# Patient Record
Sex: Female | Born: 1960 | Race: Black or African American | Hispanic: No | Smoking: Current every day smoker
Health system: Southern US, Community
[De-identification: ages and names within clinical notes are randomized; demographics above are authoritative.]

## PROBLEM LIST (undated history)

## (undated) DIAGNOSIS — M7580 Other shoulder lesions, unspecified shoulder: Secondary | ICD-10-CM

## (undated) DIAGNOSIS — Z79899 Other long term (current) drug therapy: Secondary | ICD-10-CM

## (undated) DIAGNOSIS — R002 Palpitations: Secondary | ICD-10-CM

## (undated) DIAGNOSIS — J301 Allergic rhinitis due to pollen: Secondary | ICD-10-CM

## (undated) DIAGNOSIS — R109 Unspecified abdominal pain: Secondary | ICD-10-CM

## (undated) DIAGNOSIS — E049 Nontoxic goiter, unspecified: Secondary | ICD-10-CM

## (undated) DIAGNOSIS — K069 Disorder of gingiva and edentulous alveolar ridge, unspecified: Secondary | ICD-10-CM

## (undated) DIAGNOSIS — R32 Unspecified urinary incontinence: Secondary | ICD-10-CM

## (undated) DIAGNOSIS — R05 Cough: Secondary | ICD-10-CM

## (undated) DIAGNOSIS — B0229 Other postherpetic nervous system involvement: Secondary | ICD-10-CM

## (undated) DIAGNOSIS — R059 Cough, unspecified: Secondary | ICD-10-CM

## (undated) DIAGNOSIS — N946 Dysmenorrhea, unspecified: Secondary | ICD-10-CM

## (undated) DIAGNOSIS — F419 Anxiety disorder, unspecified: Secondary | ICD-10-CM

## (undated) DIAGNOSIS — R9431 Abnormal electrocardiogram [ECG] [EKG]: Secondary | ICD-10-CM

## (undated) DIAGNOSIS — K921 Melena: Secondary | ICD-10-CM

## (undated) DIAGNOSIS — R5383 Other fatigue: Secondary | ICD-10-CM

## (undated) DIAGNOSIS — J209 Acute bronchitis, unspecified: Secondary | ICD-10-CM

## (undated) DIAGNOSIS — R079 Chest pain, unspecified: Secondary | ICD-10-CM

## (undated) DIAGNOSIS — M25559 Pain in unspecified hip: Secondary | ICD-10-CM

## (undated) DIAGNOSIS — M545 Low back pain, unspecified: Secondary | ICD-10-CM

## (undated) DIAGNOSIS — M461 Sacroiliitis, not elsewhere classified: Secondary | ICD-10-CM

## (undated) DIAGNOSIS — K5010 Crohn's disease of large intestine without complications: Secondary | ICD-10-CM

## (undated) DIAGNOSIS — B370 Candidal stomatitis: Secondary | ICD-10-CM

## (undated) DIAGNOSIS — K219 Gastro-esophageal reflux disease without esophagitis: Secondary | ICD-10-CM

## (undated) DIAGNOSIS — R5381 Other malaise: Secondary | ICD-10-CM

## (undated) DIAGNOSIS — I10 Essential (primary) hypertension: Secondary | ICD-10-CM

## (undated) DIAGNOSIS — F17200 Nicotine dependence, unspecified, uncomplicated: Secondary | ICD-10-CM

## (undated) DIAGNOSIS — L02229 Furuncle of trunk, unspecified: Secondary | ICD-10-CM

## (undated) DIAGNOSIS — E1165 Type 2 diabetes mellitus with hyperglycemia: Secondary | ICD-10-CM

## (undated) DIAGNOSIS — F32A Depression, unspecified: Secondary | ICD-10-CM

## (undated) DIAGNOSIS — R1031 Right lower quadrant pain: Secondary | ICD-10-CM

## (undated) DIAGNOSIS — R209 Unspecified disturbances of skin sensation: Secondary | ICD-10-CM

## (undated) DIAGNOSIS — F329 Major depressive disorder, single episode, unspecified: Secondary | ICD-10-CM

## (undated) DIAGNOSIS — K056 Periodontal disease, unspecified: Secondary | ICD-10-CM

## (undated) DIAGNOSIS — K589 Irritable bowel syndrome without diarrhea: Secondary | ICD-10-CM

## (undated) DIAGNOSIS — R064 Hyperventilation: Secondary | ICD-10-CM

## (undated) DIAGNOSIS — K5900 Constipation, unspecified: Secondary | ICD-10-CM

## (undated) DIAGNOSIS — M1990 Unspecified osteoarthritis, unspecified site: Secondary | ICD-10-CM

## (undated) DIAGNOSIS — L02239 Carbuncle of trunk, unspecified: Secondary | ICD-10-CM

## (undated) DIAGNOSIS — E119 Type 2 diabetes mellitus without complications: Secondary | ICD-10-CM

## (undated) DIAGNOSIS — A4102 Sepsis due to Methicillin resistant Staphylococcus aureus: Secondary | ICD-10-CM

## (undated) DIAGNOSIS — G8100 Flaccid hemiplegia affecting unspecified side: Secondary | ICD-10-CM

## (undated) DIAGNOSIS — R110 Nausea: Secondary | ICD-10-CM

## (undated) DIAGNOSIS — R609 Edema, unspecified: Secondary | ICD-10-CM

## (undated) DIAGNOSIS — J449 Chronic obstructive pulmonary disease, unspecified: Secondary | ICD-10-CM

## (undated) DIAGNOSIS — R809 Proteinuria, unspecified: Secondary | ICD-10-CM

## (undated) DIAGNOSIS — K5090 Crohn's disease, unspecified, without complications: Secondary | ICD-10-CM

## (undated) DIAGNOSIS — I2510 Atherosclerotic heart disease of native coronary artery without angina pectoris: Secondary | ICD-10-CM

## (undated) DIAGNOSIS — E785 Hyperlipidemia, unspecified: Secondary | ICD-10-CM

## (undated) DIAGNOSIS — K449 Diaphragmatic hernia without obstruction or gangrene: Secondary | ICD-10-CM

## (undated) DIAGNOSIS — E079 Disorder of thyroid, unspecified: Secondary | ICD-10-CM

## (undated) DIAGNOSIS — IMO0001 Reserved for inherently not codable concepts without codable children: Secondary | ICD-10-CM

## (undated) DIAGNOSIS — J45909 Unspecified asthma, uncomplicated: Secondary | ICD-10-CM

## (undated) HISTORY — DX: Furuncle of trunk, unspecified: L02.229

## (undated) HISTORY — DX: Other malaise: R53.81

## (undated) HISTORY — DX: Cough, unspecified: R05.9

## (undated) HISTORY — DX: Gastro-esophageal reflux disease without esophagitis: K21.9

## (undated) HISTORY — DX: Sepsis due to methicillin resistant Staphylococcus aureus: A41.02

## (undated) HISTORY — PX: TUBAL LIGATION: SHX77

## (undated) HISTORY — DX: Candidal stomatitis: B37.0

## (undated) HISTORY — DX: Unspecified abdominal pain: R10.9

## (undated) HISTORY — DX: Chest pain, unspecified: R07.9

## (undated) HISTORY — DX: Right lower quadrant pain: R10.31

## (undated) HISTORY — DX: Crohn's disease, unspecified, without complications: K50.90

## (undated) HISTORY — DX: Melena: K92.1

## (undated) HISTORY — DX: Sacroiliitis, not elsewhere classified: M46.1

## (undated) HISTORY — DX: Nicotine dependence, unspecified, uncomplicated: F17.200

## (undated) HISTORY — DX: Allergic rhinitis due to pollen: J30.1

## (undated) HISTORY — DX: Crohn's disease of large intestine without complications: K50.10

## (undated) HISTORY — DX: Dysmenorrhea, unspecified: N94.6

## (undated) HISTORY — DX: Constipation, unspecified: K59.00

## (undated) HISTORY — DX: Periodontal disease, unspecified: K05.6

## (undated) HISTORY — DX: Irritable bowel syndrome, unspecified: K58.9

## (undated) HISTORY — DX: Nontoxic goiter, unspecified: E04.9

## (undated) HISTORY — DX: Unspecified urinary incontinence: R32

## (undated) HISTORY — DX: Type 2 diabetes mellitus without complications: E11.9

## (undated) HISTORY — DX: Abnormal electrocardiogram (ECG) (EKG): R94.31

## (undated) HISTORY — DX: Nausea: R11.0

## (undated) HISTORY — DX: Other long term (current) drug therapy: Z79.899

## (undated) HISTORY — DX: Unspecified disturbances of skin sensation: R20.9

## (undated) HISTORY — DX: Carbuncle of trunk, unspecified: L02.239

## (undated) HISTORY — DX: Other postherpetic nervous system involvement: B02.29

## (undated) HISTORY — DX: Cough: R05

## (undated) HISTORY — DX: Unspecified asthma, uncomplicated: J45.909

## (undated) HISTORY — DX: Diaphragmatic hernia without obstruction or gangrene: K44.9

## (undated) HISTORY — DX: Hyperventilation: R06.4

## (undated) HISTORY — DX: Anxiety disorder, unspecified: F41.9

## (undated) HISTORY — DX: Other fatigue: R53.83

## (undated) HISTORY — DX: Low back pain: M54.5

## (undated) HISTORY — DX: Palpitations: R00.2

## (undated) HISTORY — DX: Hyperlipidemia, unspecified: E78.5

## (undated) HISTORY — DX: Flaccid hemiplegia affecting unspecified side: G81.00

## (undated) HISTORY — DX: Unspecified osteoarthritis, unspecified site: M19.90

## (undated) HISTORY — DX: Reserved for inherently not codable concepts without codable children: IMO0001

## (undated) HISTORY — PX: OTHER SURGICAL HISTORY: SHX169

## (undated) HISTORY — DX: Disorder of gingiva and edentulous alveolar ridge, unspecified: K06.9

## (undated) HISTORY — DX: Acute bronchitis, unspecified: J20.9

## (undated) HISTORY — DX: Pain in unspecified hip: M25.559

## (undated) HISTORY — DX: Low back pain, unspecified: M54.50

## (undated) HISTORY — DX: Type 2 diabetes mellitus with hyperglycemia: E11.65

## (undated) HISTORY — DX: Edema, unspecified: R60.9

---

## 1980-07-17 HISTORY — PX: OTHER SURGICAL HISTORY: SHX169

## 1991-07-18 HISTORY — PX: OTHER SURGICAL HISTORY: SHX169

## 1997-10-20 ENCOUNTER — Other Ambulatory Visit: Admission: RE | Admit: 1997-10-20 | Discharge: 1997-10-20 | Payer: Self-pay | Admitting: Gynecology

## 2000-06-05 ENCOUNTER — Emergency Department (HOSPITAL_COMMUNITY): Admission: EM | Admit: 2000-06-05 | Discharge: 2000-06-05 | Payer: Self-pay | Admitting: Emergency Medicine

## 2000-06-05 ENCOUNTER — Encounter: Payer: Self-pay | Admitting: Emergency Medicine

## 2001-11-06 ENCOUNTER — Encounter: Payer: Self-pay | Admitting: Emergency Medicine

## 2001-11-06 ENCOUNTER — Emergency Department (HOSPITAL_COMMUNITY): Admission: EM | Admit: 2001-11-06 | Discharge: 2001-11-07 | Payer: Self-pay | Admitting: Emergency Medicine

## 2001-11-27 ENCOUNTER — Encounter: Payer: Self-pay | Admitting: Internal Medicine

## 2001-11-27 ENCOUNTER — Ambulatory Visit (HOSPITAL_COMMUNITY): Admission: RE | Admit: 2001-11-27 | Discharge: 2001-11-27 | Payer: Self-pay | Admitting: Internal Medicine

## 2003-08-03 ENCOUNTER — Emergency Department (HOSPITAL_COMMUNITY): Admission: EM | Admit: 2003-08-03 | Discharge: 2003-08-03 | Payer: Self-pay | Admitting: Emergency Medicine

## 2004-02-29 ENCOUNTER — Emergency Department (HOSPITAL_COMMUNITY): Admission: EM | Admit: 2004-02-29 | Discharge: 2004-02-29 | Payer: Self-pay | Admitting: Emergency Medicine

## 2004-09-08 ENCOUNTER — Encounter (INDEPENDENT_AMBULATORY_CARE_PROVIDER_SITE_OTHER): Payer: Self-pay | Admitting: *Deleted

## 2004-09-08 ENCOUNTER — Ambulatory Visit (HOSPITAL_COMMUNITY): Admission: RE | Admit: 2004-09-08 | Discharge: 2004-09-08 | Payer: Self-pay | Admitting: Gastroenterology

## 2004-09-20 ENCOUNTER — Encounter: Admission: RE | Admit: 2004-09-20 | Discharge: 2004-09-20 | Payer: Self-pay | Admitting: Gastroenterology

## 2006-01-04 ENCOUNTER — Encounter: Admission: RE | Admit: 2006-01-04 | Discharge: 2006-01-04 | Payer: Self-pay | Admitting: Internal Medicine

## 2006-07-25 ENCOUNTER — Emergency Department (HOSPITAL_COMMUNITY): Admission: EM | Admit: 2006-07-25 | Discharge: 2006-07-25 | Payer: Self-pay | Admitting: Emergency Medicine

## 2006-08-20 ENCOUNTER — Ambulatory Visit: Payer: Self-pay | Admitting: Internal Medicine

## 2006-08-27 ENCOUNTER — Ambulatory Visit: Payer: Self-pay | Admitting: Internal Medicine

## 2006-08-28 ENCOUNTER — Ambulatory Visit: Payer: Self-pay | Admitting: Gastroenterology

## 2006-08-28 LAB — CONVERTED CEMR LAB
ALT: 10 units/L (ref 0–40)
BUN: 11 mg/dL (ref 6–23)
Eosinophils Absolute: 0.3 10*3/uL (ref 0.0–0.6)
Eosinophils Relative: 2.5 % (ref 0.0–5.0)
GFR calc non Af Amer: 82 mL/min
Glucose, Bld: 126 mg/dL — ABNORMAL HIGH (ref 70–99)
Hemoglobin: 14.4 g/dL (ref 12.0–15.0)
Monocytes Absolute: 0.3 10*3/uL (ref 0.2–0.7)
Monocytes Relative: 2.6 % — ABNORMAL LOW (ref 3.0–11.0)
Neutrophils Relative %: 77.1 % — ABNORMAL HIGH (ref 43.0–77.0)
Potassium: 4 meq/L (ref 3.5–5.1)
RBC: 5.14 M/uL — ABNORMAL HIGH (ref 3.87–5.11)
Sed Rate: 13 mm/hr (ref 0–25)
TSH: 0.3 microintl units/mL — ABNORMAL LOW (ref 0.35–5.50)
Total Bilirubin: 0.6 mg/dL (ref 0.3–1.2)
WBC: 13.4 10*3/uL — ABNORMAL HIGH (ref 4.5–10.5)

## 2006-08-31 ENCOUNTER — Ambulatory Visit: Payer: Self-pay | Admitting: Cardiology

## 2006-09-10 ENCOUNTER — Ambulatory Visit: Payer: Self-pay | Admitting: Gastroenterology

## 2006-09-10 ENCOUNTER — Encounter (INDEPENDENT_AMBULATORY_CARE_PROVIDER_SITE_OTHER): Payer: Self-pay | Admitting: Specialist

## 2006-09-10 HISTORY — PX: COLONOSCOPY: SHX174

## 2006-09-19 ENCOUNTER — Ambulatory Visit: Payer: Self-pay | Admitting: Internal Medicine

## 2006-10-03 ENCOUNTER — Ambulatory Visit: Payer: Self-pay | Admitting: Internal Medicine

## 2006-11-13 ENCOUNTER — Encounter: Admission: RE | Admit: 2006-11-13 | Discharge: 2006-11-13 | Payer: Self-pay | Admitting: General Surgery

## 2006-12-20 ENCOUNTER — Emergency Department (HOSPITAL_COMMUNITY): Admission: EM | Admit: 2006-12-20 | Discharge: 2006-12-20 | Payer: Self-pay | Admitting: Emergency Medicine

## 2008-04-09 IMAGING — CT CT ABDOMEN W/ CM
2 of 5 series · 17 of 46 positions shown, 19 images · IV contrast (omnipaque)
Comparison: none

CLINICAL DATA: Right lower quadrant pain.  Possible history of Crohn?s disease.  Diarrhea and constipation.  
 ABDOMEN CT WITH CONTRAST:
TECHNIQUE: Multidetector CT imaging of the abdomen was performed following the standard protocol during bolus administration of intravenous contrast.
 Contrast:  125 cc Omnipaque 300.
TECHNIQUE: Multidetector CT imaging of the pelvis was performed following the standard protocol during bolus administration of intravenous contrast.

[Series 2: abd_pel_xxl 5.0 b10f st · axial · 0.80mm/px · z∈[-459,-59]mm · 14 of 90 slices shown, 16 images]
[im 5/90  soft-tissue]
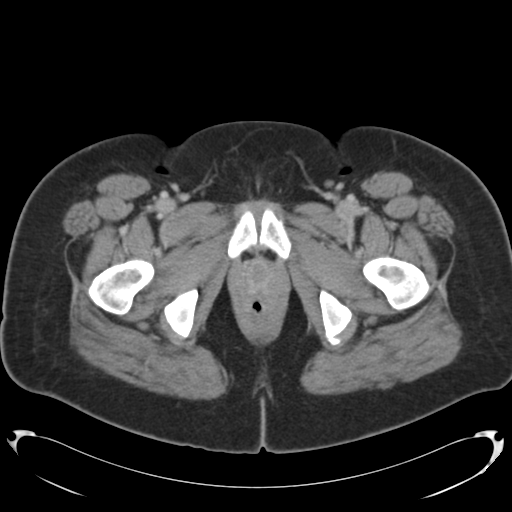
[im 5/90  bone]
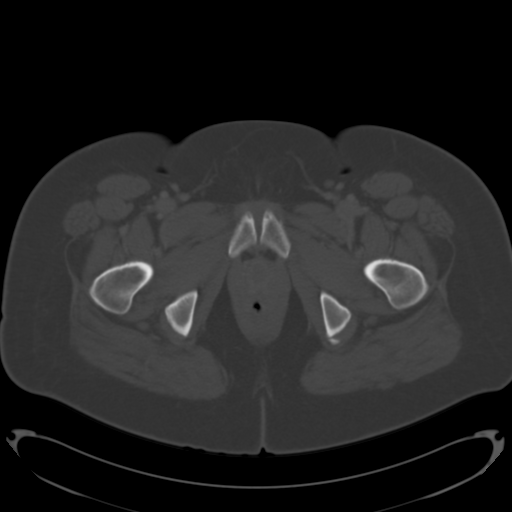
[im 10/90  soft-tissue]
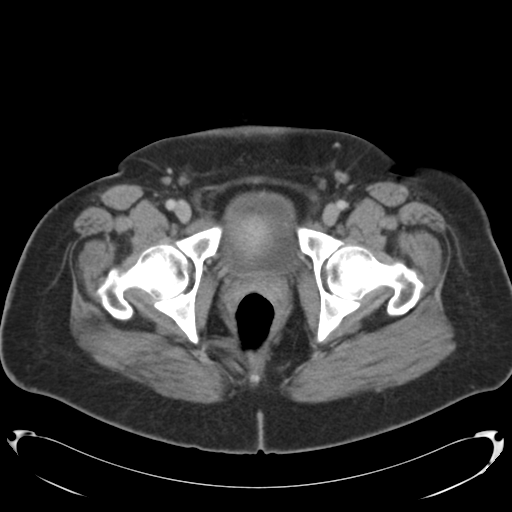
[im 20/90  soft-tissue]
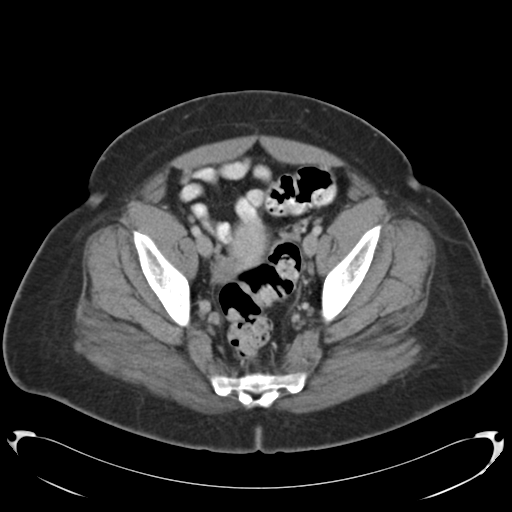
[im 25/90  soft-tissue]
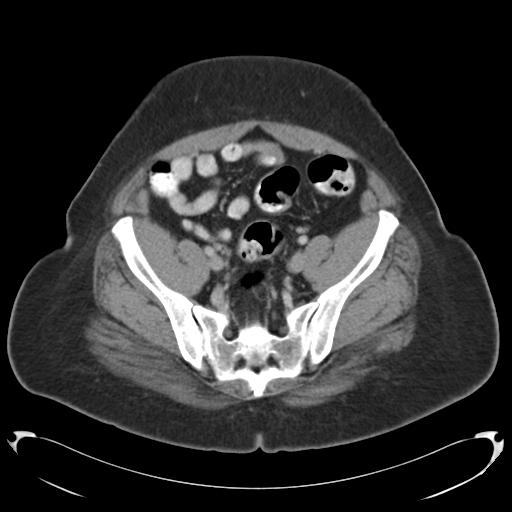
[im 30/90  soft-tissue]
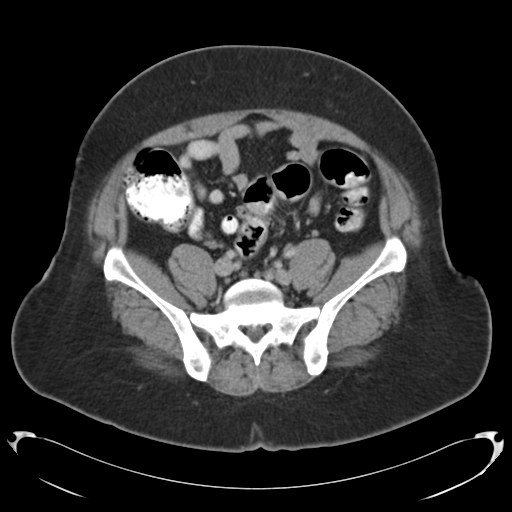
[im 35/90  soft-tissue]
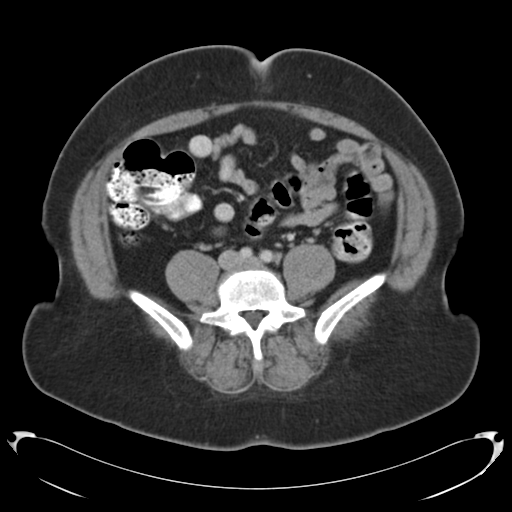
[im 40/90  soft-tissue]
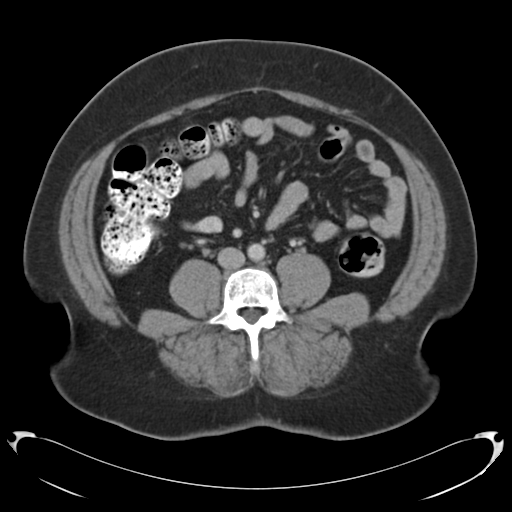
[im 50/90  soft-tissue]
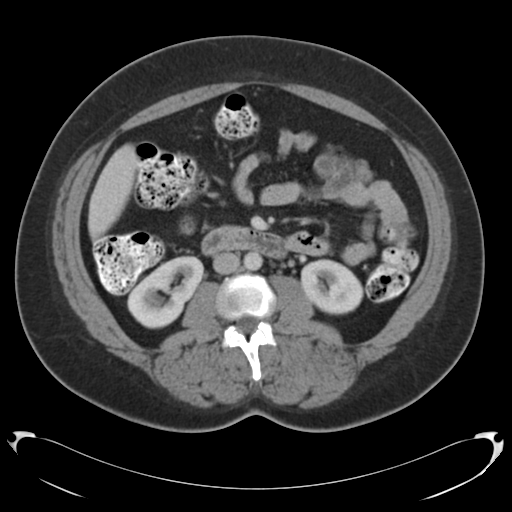
[im 55/90  soft-tissue]
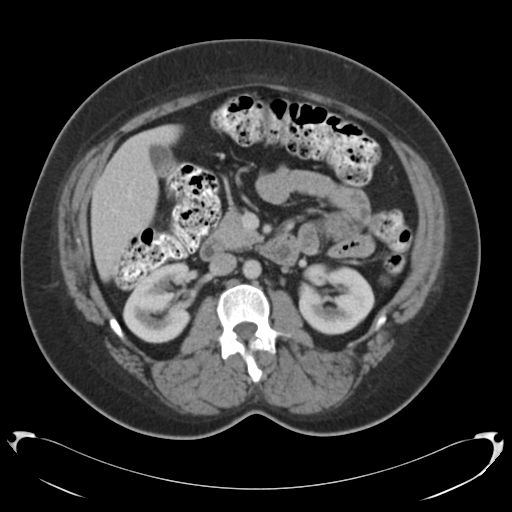
[im 55/90  bone]
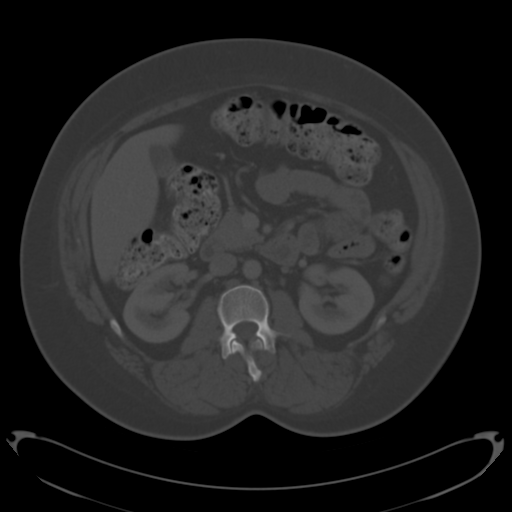
[im 60/90  soft-tissue]
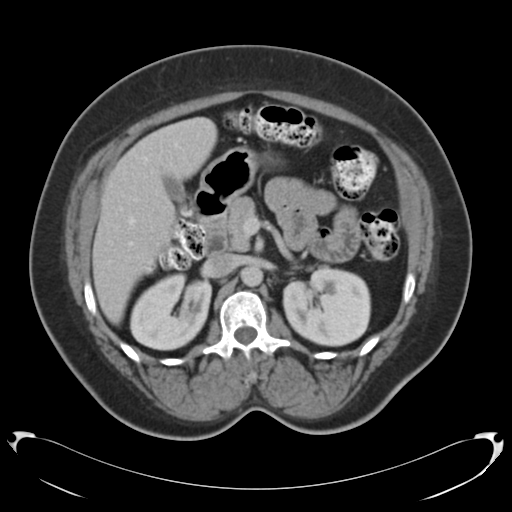
[im 65/90  soft-tissue]
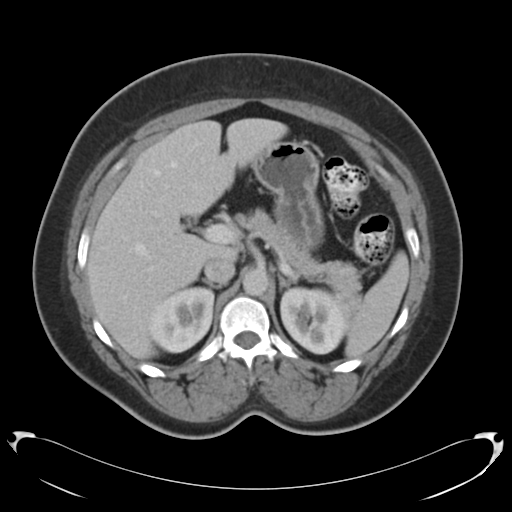
[im 70/90  soft-tissue]
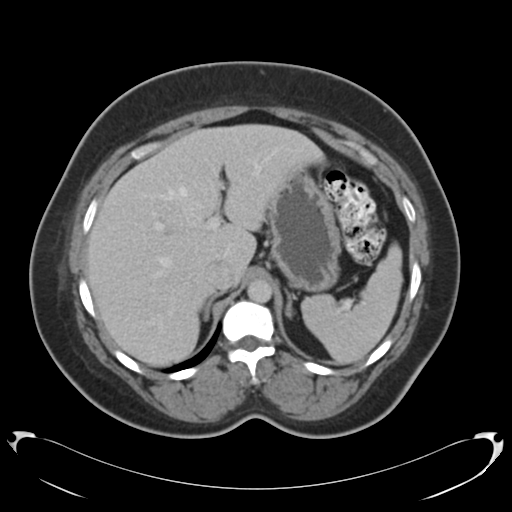
[im 80/90  soft-tissue]
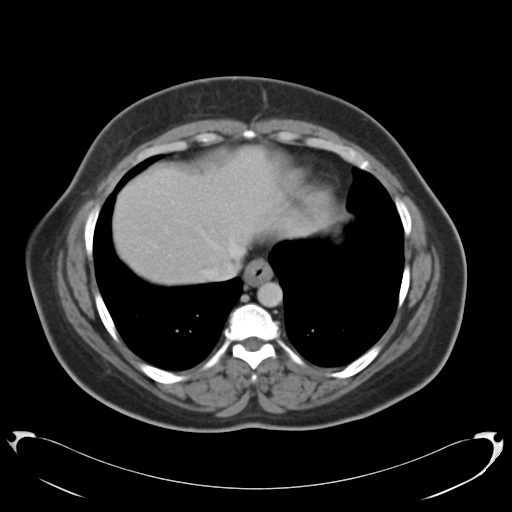
[im 85/90  soft-tissue]
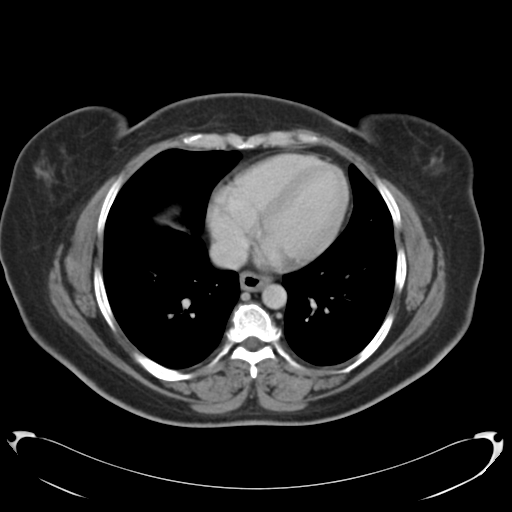

[Series 602: <mpr thick range> · coronal · 0.90mm/px · 3 of 92 slices shown]
[im 31/92  soft-tissue]
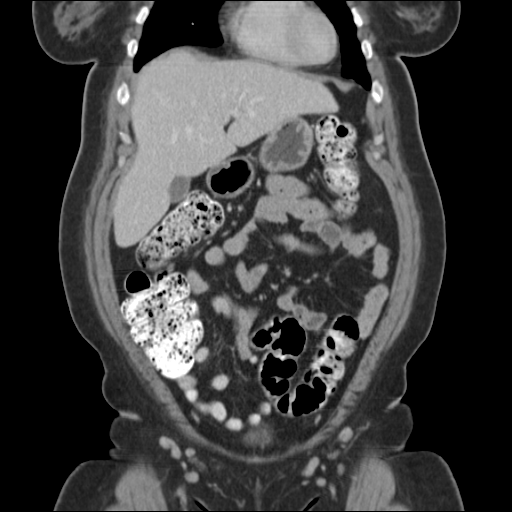
[im 41/92  soft-tissue]
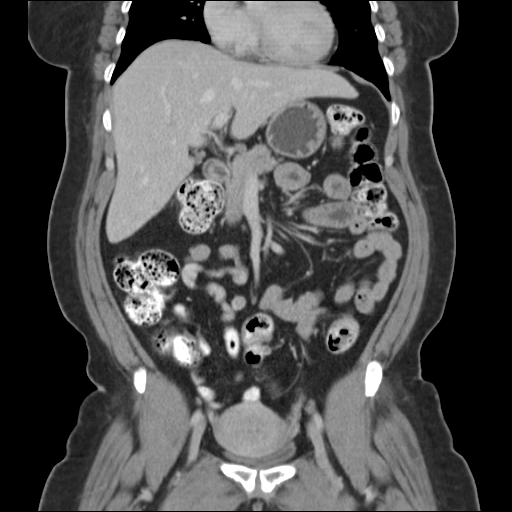
[im 51/92  soft-tissue]
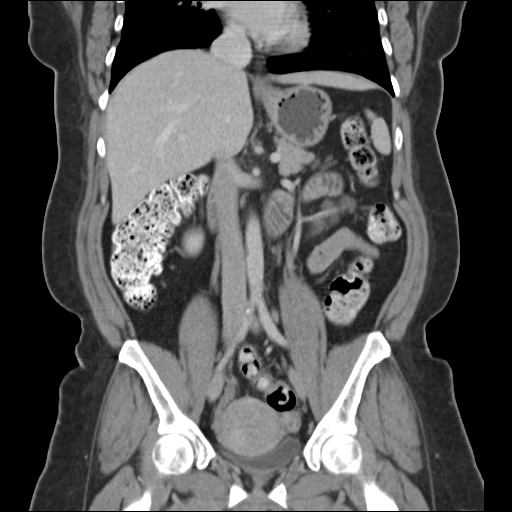

[17 of 46 positions shown; findings below may reference images not displayed]

FINDINGS: The lung bases are clear.  No pleural or pericardial fluid. The liver has a normal appearance without focal lesions or biliary ductal dilatation.  No calcified gallstones. The spleen is normal.  The pancreas is normal.  The adrenal glands are normal.  The kidneys are normal.   The aorta and IVC are normal.  No retroperitoneal mass or adenopathy.  No free intraperitoneal fluid or air.  There is a fairly large amount of fecal matter in the colon.  No small bowel pathology is seen in the abdominal portion of the scan.  Specifically, the terminal ileum is well seen and appears normal.  I think I can the appendix and no inflammatory changes seen affecting mass structure.
IMPRESSION: Normal CT scan of the abdomen.
 PELVIS CT WITH CONTRAST:
FINDINGS: No free fluid in the pelvis.  The uterus and adnexal regions appear unremarkable.  No bowel pathology is seen.  The bony structures are unremarkable.
IMPRESSION: Negative CT scan of the pelvis.

## 2008-08-24 ENCOUNTER — Encounter: Admission: RE | Admit: 2008-08-24 | Discharge: 2008-08-24 | Payer: Self-pay | Admitting: *Deleted

## 2008-09-08 ENCOUNTER — Encounter: Admission: RE | Admit: 2008-09-08 | Discharge: 2008-09-08 | Payer: Self-pay | Admitting: Obstetrics and Gynecology

## 2008-11-18 ENCOUNTER — Emergency Department (HOSPITAL_COMMUNITY): Admission: EM | Admit: 2008-11-18 | Discharge: 2008-11-18 | Payer: Self-pay | Admitting: Emergency Medicine

## 2010-02-09 ENCOUNTER — Emergency Department (HOSPITAL_COMMUNITY): Admission: EM | Admit: 2010-02-09 | Discharge: 2010-02-09 | Payer: Self-pay | Admitting: Emergency Medicine

## 2010-07-17 LAB — HM MAMMOGRAPHY: HM Mammogram: NORMAL

## 2010-10-01 LAB — POCT I-STAT, CHEM 8
Calcium, Ion: 1.07 mmol/L — ABNORMAL LOW (ref 1.12–1.32)
Chloride: 103 mEq/L (ref 96–112)
Creatinine, Ser: 0.8 mg/dL (ref 0.4–1.2)
Glucose, Bld: 123 mg/dL — ABNORMAL HIGH (ref 70–99)
Hemoglobin: 15.3 g/dL — ABNORMAL HIGH (ref 12.0–15.0)
TCO2: 27 mmol/L (ref 0–100)

## 2010-10-01 LAB — GLUCOSE, CAPILLARY: Glucose-Capillary: 124 mg/dL — ABNORMAL HIGH (ref 70–99)

## 2010-10-25 LAB — POCT I-STAT, CHEM 8
BUN: 10 mg/dL (ref 6–23)
Chloride: 103 mEq/L (ref 96–112)
Glucose, Bld: 144 mg/dL — ABNORMAL HIGH (ref 70–99)
HCT: 50 % — ABNORMAL HIGH (ref 36.0–46.0)
Sodium: 141 mEq/L (ref 135–145)
TCO2: 28 mmol/L (ref 0–100)

## 2010-10-25 LAB — POCT CARDIAC MARKERS
Myoglobin, poc: 44.6 ng/mL (ref 12–200)
Troponin i, poc: <0.05 ng/mL (ref 0.00–0.09)

## 2010-10-25 LAB — D-DIMER, QUANTITATIVE: D-Dimer, Quant: 0.52 ug/mL-FEU — ABNORMAL HIGH (ref 0.00–0.48)

## 2010-11-23 ENCOUNTER — Emergency Department (HOSPITAL_COMMUNITY)
Admission: EM | Admit: 2010-11-23 | Discharge: 2010-11-23 | Disposition: A | Discharge status: Home / Self Care | Payer: Medicare Other | Attending: Emergency Medicine | Admitting: Emergency Medicine

## 2010-11-23 ENCOUNTER — Encounter (HOSPITAL_COMMUNITY): Payer: Self-pay | Admitting: Radiology

## 2010-11-23 ENCOUNTER — Emergency Department (HOSPITAL_COMMUNITY): Payer: Medicare Other

## 2010-11-23 DIAGNOSIS — Z79899 Other long term (current) drug therapy: Secondary | ICD-10-CM | POA: Insufficient documentation

## 2010-11-23 DIAGNOSIS — R109 Unspecified abdominal pain: Secondary | ICD-10-CM | POA: Insufficient documentation

## 2010-11-23 DIAGNOSIS — R0602 Shortness of breath: Secondary | ICD-10-CM | POA: Insufficient documentation

## 2010-11-23 DIAGNOSIS — K5090 Crohn's disease, unspecified, without complications: Secondary | ICD-10-CM | POA: Insufficient documentation

## 2010-11-23 DIAGNOSIS — I10 Essential (primary) hypertension: Secondary | ICD-10-CM | POA: Insufficient documentation

## 2010-11-23 DIAGNOSIS — E119 Type 2 diabetes mellitus without complications: Secondary | ICD-10-CM | POA: Insufficient documentation

## 2010-11-23 HISTORY — DX: Crohn's disease, unspecified, without complications: K50.90

## 2010-11-23 LAB — URINALYSIS, ROUTINE W REFLEX MICROSCOPIC
Bilirubin Urine: NEGATIVE
Ketones, ur: NEGATIVE mg/dL
Leukocytes, UA: NEGATIVE
Nitrite: NEGATIVE
Specific Gravity, Urine: 1.012 (ref 1.005–1.030)
Urobilinogen, UA: 0.2 mg/dL (ref 0.0–1.0)
pH: 6.5 (ref 5.0–8.0)

## 2010-11-23 LAB — POCT CARDIAC MARKERS
CKMB, poc: <1 ng/mL — ABNORMAL LOW (ref 1.0–8.0)
Myoglobin, poc: 36.4 ng/mL (ref 12–200)
Troponin i, poc: <0.05 ng/mL (ref 0.00–0.09)

## 2010-11-23 LAB — CBC
HCT: 41.2 % (ref 36.0–46.0)
Platelets: 291 10*3/uL (ref 150–400)
RBC: 5.15 MIL/uL — ABNORMAL HIGH (ref 3.87–5.11)

## 2010-11-23 LAB — COMPREHENSIVE METABOLIC PANEL
ALT: 15 U/L (ref 0–35)
BUN: 9 mg/dL (ref 6–23)
CO2: 25 mEq/L (ref 19–32)
Calcium: 9.5 mg/dL (ref 8.4–10.5)
Chloride: 100 mEq/L (ref 96–112)
Glucose, Bld: 240 mg/dL — ABNORMAL HIGH (ref 70–99)
Sodium: 134 mEq/L — ABNORMAL LOW (ref 135–145)
Total Bilirubin: 0.2 mg/dL — ABNORMAL LOW (ref 0.3–1.2)

## 2010-11-23 LAB — GLUCOSE, CAPILLARY

## 2010-11-23 LAB — DIFFERENTIAL
Lymphocytes Relative: 22 % (ref 12–46)
Lymphs Abs: 2.3 10*3/uL (ref 0.7–4.0)
Monocytes Absolute: 0.3 10*3/uL (ref 0.1–1.0)
Neutro Abs: 7.8 10*3/uL — ABNORMAL HIGH (ref 1.7–7.7)
Neutrophils Relative %: 74 % (ref 43–77)

## 2010-11-23 LAB — URINE MICROSCOPIC-ADD ON

## 2010-11-23 MED ORDER — IOHEXOL 300 MG/ML  SOLN
100.0000 mL | Freq: Once | INTRAMUSCULAR | Status: AC | PRN
  Administered 2010-11-23: 100 mL via INTRAVENOUS

## 2010-12-02 NOTE — Assessment & Plan Note (Signed)
[facility]                             PULMONARY OFFICE NOTE   LENEA, BYWATER                     MRN:          [id number]  DATE:10/03/2006                            DOB:          21-Jul-1960    HISTORY OF PRESENT ILLNESS:  This is a 50 year old African-American  female patient of Dr. [name], who was recently seen in pulmonary  consultation for shortness of breath, cough and abnormal CT chest.  The  patient was found to have a subtle micronodular pattern on CT in a  patient who continues to smoke.  The patient returns today for complete  medication review.  The patient has been having difficulty following  instructions for her medications.  She was recommended to increase  AcipHex up to twice a day and begin on Mucinex DM along with tramadol;  however, the patient has not been taking cough suppression regimen as  recommended.  The patient has recently tapered off of prednisone and  continues to have a persistent cough and wheezing.  The patient has also  been discontinued off Azmacort.  She is maintained on DuoNeb nebulizer  four times a day.  The patient denies any purulent sputum, hemoptysis,  chest pain, orthopnea, PND or leg swelling.   PAST MEDICAL HISTORY:  Reviewed.   CURRENT MEDICATIONS:  Reviewed.   PHYSICAL EXAMINATION:  GENERAL:  The patient is a pleasant female in no  acute distress.  VITAL SIGNS:  She is afebrile with stable vital signs.  O2 saturation is  98% on room air.  HEENT:  Unremarkable.  NECK:  Without cervical adenopathy.  No JVD.  LUNGS:  Lungs reveal coarse breath sounds without any wheezing or  crackles.  CARDIAC:  Regular rate.  ABDOMEN:  Soft and nontender.  EXTREMITIES:  Warm without any edema.   IMPRESSION AND PLAN:  1. Cough and dyspnea in a patient who continues to smoke.  The patient      has a prescription for Chantix and is to begin this in 2 days with      a quit date for next week.  The patient will  continue on DuoNeb      nebulizer four times a day and begin an aggressive cough      suppression regimen with Mucinex DM and tramadol along with non-      mint lozenges.  The patient will return here in 2-3 weeks with Dr.      [name] or sooner if needed.  2. Complex medication regimen.  The patient's medications were      reviewed in detail.  Patient education was provided.  A      computerized medication calendar was completed for this      patient and reviewed in detail.  The patient is aware to bring this      back to each and very visit.      Rubye Oaks, NP  Electronically Signed      [name]. [name], MD, [facility]  Electronically Signed   TP/MedQ  DD: 10/03/2006  DT: 10/03/2006  Job #: [id number]

## 2010-12-02 NOTE — Assessment & Plan Note (Signed)
[facility]                             PULMONARY OFFICE NOTE   [name], [name]                     MRN:          [id number]  DATE:09/19/2006                            DOB:          May 28, 1961    PULMONARY/EXTENDED FOLLOWUP OFFICE VISIT:   HISTORY:  Exceptionally challenging, 50 year old black female with  dyspnea, but dating back approximately three months, in the setting of  suspected COPD.  When I saw her in consultation on August 20, 2006, for  abnormal CT scan.  Note that her baseline chest x-ray, January 10, was  normal, but on January 11, she had multiple nodules.  She continues to  smoke actively and also not follow any of the instructions I gave her  previously, and did not appear to recognize the instruction sheet she  was given (for which I had a copy, dated the day I saw her).  She came  in, chewing gum that clearly had mint in it.  (My first instruction on  the sheet was to stop all mint.)  She denies any excess sputum  production, fevers, chills, sweats, orthopnea, PND, or leg-swelling,  tend to weight-loss.   For full list of her medications, please see face sheet, dated September 19, 2006, although I have no confidence at all that she takes these  medications as prescribed, as she told me, I don't take those medicines  that way anyway.   She did, however, verbalize that she is interested in stopping smoking  and is thinking about starting Chantix.   PHYSICAL EXAM:  She is an animated, pleasant, black female, in no acute  distress.  She has stable vital signs.  HEENT:  Unremarkable.  Oropharynx is clear.  NECK:  Supple without cervical adenopathy or tenderness.  Trachea is  midline.  No thyromegaly.  LUNG FIELDS:  Reveal diminished breath sounds bilaterally.  No wheezing.  She had classic pseudo-wheeze that resolved with pursed-lip maneuver and  when I asked her to quit making that noise with your voice when you  breathe.  HEART:  Regular rhythm without murmur, gallop or rub.  ABDOMEN:  Soft, benign.  EXTREMITIES:  Warm, without calf tenderness, cyanosis, clubbing or  edema.   Her PFTs were reviewed from August 27, 2006, indicating FEV1 of 79%  predicted with a ratio of 73% and only minimal evidence supporting  airways disease with nonspecific reduction in mid-flow rates.  Diffusion  capacity was 64%, corrected to 117%.   Chest x-ray was ordered, but is pending.   IMPRESSION:  I am not confident actually that this patient either has  significant asthma Ibut certainly does not have significant copd) and  note that, interestingly, she has no problems with her chronic cough  while sleeping, but as soon as she is awake, she has the sensation of a  lot of throat mucus with minimal mucoid sputum production and prominent  pseudo-wheeze on exam, strongly suggesting reflux as a unifying  diagnosis.   I, therefore, spent an extra 15-20 minutes of the 25-minute visit going,  again, over specific instructions for her, which include  that she should  take the AcipHex perfectly regularly 30-60 minutes before meals twice  daily and avoid all mint and menthol-containing products.  I think it  would be appropriate, at this point, to switch many of her pulmonary  medicines over to a p.r.n. basis, but before we do anything, I want to  get a full handle on exactly what she takes and I am going to,  therefore, move the mountain to [facility] by rather than telling her  what to take, ask her to come back and tell us what she takes and then  see what we can do to negotiate a reasonable medication plan for her.   I would strongly caution those involved in her care that this patient is  clearly not following the instructions that she is given, to the point  of potentially making empiric trials an exercise in futility.   I was, however, encouraged that the patient is considering stopping  smoking, which I would like her to  discuss directly with Dr. Frederik Pear.     [name]. [name], MD, [facility]  Electronically Signed    MBW/MedQ  DD: 09/19/2006  DT: 09/19/2006  Job #: [id number]   cc:   Lenon Curt Chilton Si, M.D.

## 2010-12-02 NOTE — Op Note (Signed)
[name] [name], [name] [name] NO.:  [id number]   MEDICAL RECORD NO.:  [id number]          PATIENT TYPE:  AMB   LOCATION:  ENDO                         FACILITY:  [facility]   PHYSICIAN:  John C. Madilyn Fireman, M.D.    DATE OF BIRTH:  05-Jun-1961   DATE OF PROCEDURE:  09/08/2004  DATE OF DISCHARGE:                                 OPERATIVE REPORT   PROCEDURE:  Esophagogastroduodenoscopy with esophageal dilatation.   INDICATIONS FOR PROCEDURE:  Solid food dysphagia.   PROCEDURE:  The patient was placed in the left lateral decubitus position  and placed on the pulse monitor with continuous low-flow oxygen delivered by  nasal cannula. She was sedated with 80 mcg IV fentanyl and 8 mg IV Versed.  The Olympus video endoscope was advanced under direct vision into the  oropharynx and esophagus.  The esophagus was straight and of normal caliber  with the squamocolumnar line at 38 cm.  I could not distinctly see a ring or  stricture and no visible esophagitis. Stomach was entered. A small amount of  liquid secretions were suctioned from the fundus. Retroflexed view of the  cardia was unremarkable. The fundus, body and proximal antrum appeared  normal. In the distal antrum there was a slight star-shaped deformity just  prior to the pylorus, possibly consistent with previous ulcer although there  is no crater seen. There was some erythema and granularity surrounding it.  The pylorus itself was nondeformed and easily allowed passage of the  endoscope tip to the duodenum. There was shallow 5 mm proximal bulb ulcer  with no stigma of hemorrhage.  CLO-test was obtained from the adjacent  antrum.  The scope was advanced as far as possible down the distal duodenum  and the guidewire passed into the second portion.  The scope was withdrawn  and a 17-mm Savary dilator was passed over the guidewire with minimal  resistance and no blood seen on withdrawal.  The dilator was removed  together with the wire  and the patient prepared for colonoscopy. She  tolerated the procedure well and there were no immediate complications.   IMPRESSION:  1.  Normal gastroesophageal  junction, status post empiric dilatation due to      typical solid food dysphagia.  2.  Shallow duodenal ulcer.   PLAN:  Await CLO-test and advance diet and observe response to dilatation.      JCH/MEDQ  D:  09/08/2004  T:  09/08/2004  Job:  [id number]   cc:   Lenon Curt. Chilton Si, M.D.  [address].  [place]  [place] [address]  Fax: [phone]

## 2010-12-02 NOTE — Assessment & Plan Note (Signed)
[facility]                             PULMONARY OFFICE NOTE   [name] [name], [name] [name]                     MRN:          [id number]  DATE:08/20/2006                            DOB:          11-07-1960    REASON FOR CONSULTATION:  Dyspnea.   HISTORY:  This is a 50 year old black female with indolent onset of  dyspnea approximately 2 months ago, progressively worsening since that  time with associated cough productive of thick white mucus but no  purulent sputum, fevers, chills, sweats, orthopnea, PND, or leg  swelling.  She has already been started on Azmacort with no apparent  improvement and also has a nebulizer that does not help much either.   PAST MEDICAL HISTORY:  1. Hypertension but this patient is not on ACE inhibitors or beta-      blockers.  2. She also has a history of diabetes.  3. Chronic sinus congestion.  4. Previous foot surgery.  5. Significant for Crohn's disease.  6. Hypothyroidism.  7. Anxiety/depression.  8. 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   ALLERGIES:  1. IMIPRAMINE.  2. AVELOX.  3. TETRACYCLINE.  ALL WITH NONSPECIFIC REACTION.   MEDICATIONS:  Taken in detail on the worksheet and do include prednisone  which only helped a little.   SOCIAL HISTORY:  She continues to smoke a pack per day and is thinking  about stopping.  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   FAMILY HISTORY:  Recorded in detail, significant for allergies and  asthma, everyone.   REVIEW OF SYSTEMS:  Taken in detail on the worksheet and significant for  the problems as outlined above.   PHYSICAL EXAMINATION:  GENERAL:  This is a slightly anxious ambulatory  black female in no acute distress.  VITAL SIGNS:  The patient is afebrile with normal vital signs.  HEENT:  Reveals minimal turbinates edema.  Oropharynx is clear.  NECK:  Supple without cervical adenopathy or tenderness.  Trachea is  midline.  No thyromegaly.  LUNGS:   Fields revealed a few expiratory rhonchi bilaterally with  diminished but adequate air movement.  HEART:  There is a regular rate and rhythm without murmur, gallop, or  rub present.  ABDOMEN:  Obese but otherwise benign.  EXTREMITIES:  Warm without calf tenderness, cyanosis, clubbing, edema.   Chest x-ray dated July 26, 2006, is normal.  However, a CT scan dated  July 27, 2006, indicates very subtle micronodular pattern with  prominent bilateral hilar adenopathy.   IMPRESSION:  New onset asthmatic bronchitis in this active smoker with a  normal chest x-ray but a subtle micronodular pattern on CT scan that is  totally nonspecific and can be seen in patients who smoke, and patients  with ulcerative colitis, and unfortunately also in patients with early  disseminated tuberculosis.  (Note, that this patient does have a  positive PPD and is maintained on prednisone, so this is certainly a  risk.)   However, I believe that most of her acute symptoms presently are smoking  related and recommended the following  specifics in writing.  1. Increase prednisone to 20 mg a day until better, then 2 daily      thereafter.  2. Continue to use her nebulizer 4 times daily.  3. Stop Azmacort and Tussionex since they do not seem to be helping      and start Mucinex DM two b.i.d. p.r.n. cough, tramadol 50 mg one      q.4 h. p.r.n. excessive cough.  4. I would like her also to double the Aciphex she was supposed to be      taking daily anyway (I found out she was really only taking is      sparingly) and gave her plenty of samples, so she can complete a      b.i.d. course of Aciphex for four weeks and then return here for      followup PFTs.   We will certainly see her sooner if needed.     [name]. [name] Sires, MD, [facility]  Electronically Signed    MBW/MedQ  DD: 08/20/2006  DT: 08/20/2006  Job #: [id number]   cc:   Lenon Curt. Chilton Si, M.D.

## 2010-12-02 NOTE — Assessment & Plan Note (Signed)
[name] [name]                         GASTROENTEROLOGY OFFICE NOTE   [name] [name], [name] [name]                     MRN:          [id number]  DATE:08/28/2006                            DOB:          24-Apr-1961    REASON FOR REFERRAL:  Dr. Chilton Si asked me to evaluate [name] [name]  regarding possible history of Crohn's, right lower quadrant pain, bright  red blood per rectum.   HISTORY OF PRESENT ILLNESS:  [name] [name] is a 50 year old woman who  began having right lower quadrant pains 2-3 years ago.  She eventually  had a CT scan of her abdomen with IV and oral contrast at [facility] in February of 2006.  This showed a considerable amount of  bowel wall thickening and edema in the terminal ileum and around the  cecal tip.  They thought maybe this was consistent with Crohn's  disease.  She was referred to Dr. Dorena Cookey at [facility],  who performed an EGD and a colonoscopy.  The EGD showed a shallow  duodenal ulcer.  She also had some solid food dysphagia, and he  empirically dilated her GE junction.  His CT scan showed a vague mass  involving the base of the cecum.  It was very hard, diffusely granular.  This was correlated with the CT findings done previous month.  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  There are focal crypt abscesses, no obvious  granulomas.  It is not clear what happened from that point.  She has  been on and off steroids since then.  She saw Dr. Madilyn Fireman once or twice in  his office, and I do see that he arranged for her to have a small bowel  follow through, approximately just a couple of weeks after her  colonoscopy, and it is read as an unremarkable small bowel follow  through with a normal terminal ileum.  She continues to have  intermittent right lower quadrant pains, as well as intermittent  bleeding and diarrhea.  She self treats with prednisone and Vicodin.  She has not gone back to see Dr. Madilyn Fireman.   REVIEW OF SYSTEMS:  Notable for intermittent, overt bright red blood per  rectum, increasing weight, alternating constipation and diarrhea.  The  rest of her review of systems is essentially normal and is available on  her nursing intake sheet.   PAST MEDICAL HISTORY:  Question Crohn's disease, summarized above.  Asthma, still smoking, thyroid trouble, anxiety, depression, allergy,  tubal ligation in 1982, foot surgeries, 1992, 1993, and 1994.  Recent  pneumonia.   CURRENT MEDICATIONS:  1. Bumex.  2. Spironolactone.  3. Klonopin.  4. Prednisone, currently on 20 mg once daily.  5. Ipratropium.  6. Albuterol.  7. Azmacort.  8. Aciphex.  9. Effexor.  10.Hydrochlorothiazide.  11.Levothyroxine.  12.Fexofenadine.  13.Traminol.  14.Xanax.  15.Vicodin p.r.n.  16.Vicoprofin.   ALLERGIES:  AVELOX, TETRACYCLINE, AND __________.   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  Smokes a pack  of cigarettes a day.  Nondrinker.   FAMILY  HISTORY:  No colon cancer, colon polyps in family.  No history of  IBD in family.   PHYSICAL EXAMINATION:  Height 5 feet 1 inch, 200 pounds, blood pressure  126/84, pulse 92.  CONSTITUTIONAL:  Obese, otherwise well appearing.  NEUROLOGIC:  Alert and oriented x3.  EYES:  Extraocular movements intact.  MOUTH:  Oropharynx moist, no lesions.  NECK:  Supple, no lymphadenopathy.  CARDIOVASCULAR:  Heart regular rate and rhythm.  LUNGS:  Clear to auscultation bilaterally.  ABDOMEN:  Soft, mildly tender in the right lower quadrant.  EXTREMITIES:  No lower extremity edema.  SKIN:  No rashes or lesions on visible extremities.   ASSESSMENT AND PLAN:  A 50 year old woman with likely history of Crohn's  disease, right lower quadrant pains, intermittent rectal bleeding.   She did have a CT scan in 2006 suggestive of terminal ileal, perhaps  cecal, Crohn's disease.  She has essentially failed to followup  with  this, and has been self treating herself with prednisone and Vicodin  intermittently.  I think we need to really restage her bowel disease  with imaging, colonoscopy, and lab testing before I can make any  treatment recommendations.  She is currently on 20 mg of prednisone a  day, and that can certainly help quiet Crohn's disease.   I will, therefore, arrange for her to have a CBC, complete metabolic  profile, sed rate, thyroid testing done today.  Should also get a CT  scan done of her abdomen and pelvis with intravenous and oral contrast,  and I will arrange for her to have a colonoscopy done in the next 1-2  weeks.  We discussed her previous failure to follow through with other  GI MDs recommendations and she seems committed now to taking care of her  underlying bowel  disease and to not self treat as she's been doing with vicodin and  prednisone for the past couple years.     Rachael Fee, MD  Electronically Signed    DPJ/MedQ  DD: 08/28/2006  DT: 08/28/2006  Job #: [id number]   cc:   Lenon Curt. Chilton Si, M.D.

## 2010-12-02 NOTE — Op Note (Signed)
[name] [name], [name] [name] NO.:  [id number]   MEDICAL RECORD NO.:  [id number]          PATIENT TYPE:  AMB   LOCATION:  ENDO                         FACILITY:  [facility]   PHYSICIAN:  John C. Madilyn Fireman, M.D.    DATE OF BIRTH:  11-08-1960   DATE OF PROCEDURE:  09/08/2004  DATE OF DISCHARGE:                                 OPERATIVE REPORT   PROCEDURE:  Colonoscopy with biopsy.   ENDOSCOPIST:  Everardo All. Madilyn Fireman, M.D.   INDICATIONS FOR PROCEDURE:  Mass or inflammation involving terminal ileum  and/or cecum on CT scan.   PROCEDURE:  The patient was placed in the left lateral decubitus position  and placed on the pulse monitor with continuous low-flow oxygen delivered by  nasal cannula. She was sedated with 60 mcg IV fentanyl and 5 mg of IV  Versed, in addition to the medicine given for the previous EGD.  The Olympus  video colonoscope was inserted into the rectum and advanced to cecum,  confirmed by transillumination of McBurney's point and visualization of the  ileocecal valve.  The terminal ileum was intubated and explored for several  centimeters and appeared to be within normal limits.  However, in the base  of the cecum, there was a vague mass versus prominent focal inflammation  involving the base of the cecum; when biopsied, it was very hard, but it was  somewhat more diffusely granular and less friable than a typical carcinoma,  although overall, the appearance could be consistent with lymphoma,  inflammatory bowel disease or possibly an atypical carcinoma.  It was a  somewhat vaguely delineated from the surrounding mucosa.  It is conceivable  this could represent some form of appendicitis or carcinoid tumor or TB; at  any rate, multiple biopsies taken.  Distal to this mass which was primarily  was primarily seen in the base of the cecum, the ileocecal valve itself,  ascending, transverse, descending, sigmoid and the rectum appeared entirely  normal with no masses, polyps,  diverticula or other mucosal abnormalities.  Scope was then withdrawn and the patient returned to the recovery room in  stable condition.  She tolerated the procedure well and there were no  immediate complications.   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   PLAN:  Await biopsy results and will consider small bowel series based on  suggestion of small bowel involved on CT, but failure to appreciate this  endoscopically.     JCH/MEDQ  D:  09/08/2004  T:  09/08/2004  Job:  [id number]   cc:   Lenon Curt. Chilton Si, M.D.  [address].  [place]  [place] [address]  Fax: [phone]

## 2011-02-17 ENCOUNTER — Emergency Department (HOSPITAL_COMMUNITY)
Admission: EM | Admit: 2011-02-17 | Discharge: 2011-02-17 | Disposition: A | Discharge status: Home / Self Care | Payer: Medicare Other | Attending: Emergency Medicine | Admitting: Emergency Medicine

## 2011-02-17 ENCOUNTER — Emergency Department (HOSPITAL_COMMUNITY): Payer: Medicare Other

## 2011-02-17 DIAGNOSIS — I10 Essential (primary) hypertension: Secondary | ICD-10-CM | POA: Insufficient documentation

## 2011-02-17 DIAGNOSIS — R0602 Shortness of breath: Secondary | ICD-10-CM | POA: Insufficient documentation

## 2011-02-17 DIAGNOSIS — E039 Hypothyroidism, unspecified: Secondary | ICD-10-CM | POA: Insufficient documentation

## 2011-02-17 DIAGNOSIS — R0789 Other chest pain: Secondary | ICD-10-CM | POA: Insufficient documentation

## 2011-02-17 DIAGNOSIS — R059 Cough, unspecified: Secondary | ICD-10-CM | POA: Insufficient documentation

## 2011-02-17 DIAGNOSIS — E119 Type 2 diabetes mellitus without complications: Secondary | ICD-10-CM | POA: Insufficient documentation

## 2011-02-17 DIAGNOSIS — I2510 Atherosclerotic heart disease of native coronary artery without angina pectoris: Secondary | ICD-10-CM | POA: Insufficient documentation

## 2011-02-17 DIAGNOSIS — F341 Dysthymic disorder: Secondary | ICD-10-CM | POA: Insufficient documentation

## 2011-02-17 DIAGNOSIS — K5090 Crohn's disease, unspecified, without complications: Secondary | ICD-10-CM | POA: Insufficient documentation

## 2011-02-17 DIAGNOSIS — J4489 Other specified chronic obstructive pulmonary disease: Secondary | ICD-10-CM | POA: Insufficient documentation

## 2011-02-17 DIAGNOSIS — R05 Cough: Secondary | ICD-10-CM | POA: Insufficient documentation

## 2011-02-17 DIAGNOSIS — J449 Chronic obstructive pulmonary disease, unspecified: Secondary | ICD-10-CM | POA: Insufficient documentation

## 2011-05-04 LAB — CBC
Hemoglobin: 13.2
MCHC: 33.4
MCV: 78.9
RBC: 5.01
RDW: 14.7 — ABNORMAL HIGH

## 2011-05-04 LAB — URINALYSIS, ROUTINE W REFLEX MICROSCOPIC
Bilirubin Urine: NEGATIVE
Hgb urine dipstick: NEGATIVE
Ketones, ur: NEGATIVE
Specific Gravity, Urine: 1.015
Urobilinogen, UA: 0.2

## 2011-05-04 LAB — COMPREHENSIVE METABOLIC PANEL
CO2: 28
Calcium: 8.3 — ABNORMAL LOW
Creatinine, Ser: 0.68
GFR calc Af Amer: >60
GFR calc non Af Amer: >60
Glucose, Bld: 116 — ABNORMAL HIGH
Total Protein: 6.2

## 2011-05-04 LAB — DIFFERENTIAL
Lymphocytes Relative: 20
Lymphs Abs: 2.2
Neutro Abs: 7.7
Neutrophils Relative %: 69

## 2011-05-04 LAB — LIPASE, BLOOD: Lipase: 20

## 2011-07-05 ENCOUNTER — Other Ambulatory Visit: Payer: Self-pay | Admitting: Internal Medicine

## 2011-07-05 DIAGNOSIS — M545 Low back pain: Secondary | ICD-10-CM

## 2011-07-13 ENCOUNTER — Ambulatory Visit
Admission: RE | Admit: 2011-07-13 | Discharge: 2011-07-13 | Disposition: A | Discharge status: Home / Self Care | Payer: Medicare Other | Source: Ambulatory Visit | Attending: Internal Medicine | Admitting: Internal Medicine

## 2011-07-13 DIAGNOSIS — M545 Low back pain: Secondary | ICD-10-CM

## 2012-02-07 ENCOUNTER — Encounter (HOSPITAL_COMMUNITY): Payer: Self-pay | Admitting: *Deleted

## 2012-02-07 ENCOUNTER — Emergency Department (HOSPITAL_COMMUNITY)
Admission: EM | Admit: 2012-02-07 | Discharge: 2012-02-07 | Disposition: A | Discharge status: Home / Self Care | Payer: Medicare Other | Attending: Emergency Medicine | Admitting: Emergency Medicine

## 2012-02-07 DIAGNOSIS — I2510 Atherosclerotic heart disease of native coronary artery without angina pectoris: Secondary | ICD-10-CM | POA: Insufficient documentation

## 2012-02-07 DIAGNOSIS — T6391XA Toxic effect of contact with unspecified venomous animal, accidental (unintentional), initial encounter: Secondary | ICD-10-CM | POA: Insufficient documentation

## 2012-02-07 DIAGNOSIS — I10 Essential (primary) hypertension: Secondary | ICD-10-CM | POA: Insufficient documentation

## 2012-02-07 DIAGNOSIS — E039 Hypothyroidism, unspecified: Secondary | ICD-10-CM | POA: Insufficient documentation

## 2012-02-07 DIAGNOSIS — J449 Chronic obstructive pulmonary disease, unspecified: Secondary | ICD-10-CM | POA: Insufficient documentation

## 2012-02-07 DIAGNOSIS — J4489 Other specified chronic obstructive pulmonary disease: Secondary | ICD-10-CM | POA: Insufficient documentation

## 2012-02-07 DIAGNOSIS — F17200 Nicotine dependence, unspecified, uncomplicated: Secondary | ICD-10-CM | POA: Insufficient documentation

## 2012-02-07 DIAGNOSIS — E119 Type 2 diabetes mellitus without complications: Secondary | ICD-10-CM | POA: Insufficient documentation

## 2012-02-07 DIAGNOSIS — T63441A Toxic effect of venom of bees, accidental (unintentional), initial encounter: Secondary | ICD-10-CM

## 2012-02-07 DIAGNOSIS — T63461A Toxic effect of venom of wasps, accidental (unintentional), initial encounter: Secondary | ICD-10-CM | POA: Insufficient documentation

## 2012-02-07 DIAGNOSIS — K5090 Crohn's disease, unspecified, without complications: Secondary | ICD-10-CM | POA: Insufficient documentation

## 2012-02-07 HISTORY — DX: Unspecified asthma, uncomplicated: J45.909

## 2012-02-07 HISTORY — DX: Chronic obstructive pulmonary disease, unspecified: J44.9

## 2012-02-07 HISTORY — DX: Atherosclerotic heart disease of native coronary artery without angina pectoris: I25.10

## 2012-02-07 HISTORY — DX: Unspecified osteoarthritis, unspecified site: M19.90

## 2012-02-07 HISTORY — DX: Essential (primary) hypertension: I10

## 2012-02-07 HISTORY — DX: Other shoulder lesions, unspecified shoulder: M75.80

## 2012-02-07 HISTORY — DX: Disorder of thyroid, unspecified: E07.9

## 2012-02-07 MED ORDER — PREDNISONE 20 MG PO TABS
40.0000 mg | ORAL_TABLET | Freq: Once | ORAL | Status: AC
  Administered 2012-02-07: 40 mg via ORAL
  Filled 2012-02-07: qty 2

## 2012-02-07 NOTE — ED Notes (Signed)
Pt was stung by a bee to R foot around 8 pm this evening.  States pain became unbearable so she came to ED.  Pt states she took oxycodone 10 pm with no relief.  Pt did not take benadryl.  No oral edema, but pt states chest tightness.

## 2012-02-07 NOTE — ED Provider Notes (Signed)
History     CSN: [id number]  Arrival date & time 02/07/12  2230   None     Chief Complaint  Patient presents with  . Insect Bite    (Consider location/radiation/quality/duration/timing/severity/associated sxs/prior treatment) HPI Comments: Patient states she was stung by a bee on the dorsal aspect of her right foot.  Approximately 3 hours ago.  She took a Percocet 10 mg without relief.  She states she can not take ibuprofen due to her Crohn's disease.  She presents to the emergency department, with pain.  Slight erythema and slight edema but stating her pain is 10 out of 10 despite the use of Percocet  The history is provided by the patient.    Past Medical History  Diagnosis Date  . Crohn's disease     hx of crohn's dz  . COPD (chronic obstructive pulmonary disease)   . Coronary artery disease   . Hypertension   . Diabetes mellitus   . Thyroid disease     hypothyroidism  . Arthritis   . Asthma   . AC (acromioclavicular) joint bone spurs     neck    Past Surgical History  Procedure Date  . Metatarsal reconstruction 1993    No family history on file.  History  Substance Use Topics  . Smoking status: Current Everyday Smoker -- 1.0 packs/day  . Smokeless tobacco: Not on file  . Alcohol Use: No    OB History    Grav Para Term Preterm Abortions TAB SAB Ect Mult Living                  Review of Systems  Constitutional: Negative for fever.  Respiratory: Negative for shortness of breath.   Cardiovascular: Negative for chest pain.  Musculoskeletal: Negative for joint swelling.  Skin: Negative for wound.  Neurological: Negative for dizziness and numbness.    Allergies  Avelox and Tetracyclines & related  Home Medications   Current Outpatient Rx  Name Route Sig Dispense Refill  . ALBUTEROL SULFATE (2.5 MG/3ML) 0.083% IN NEBU Nebulization Take 2.5 mg by nebulization every 6 (six) hours as needed. For shortness of breath    . ALPRAZOLAM ER 1 MG PO TB24  Oral Take 1 mg by mouth 3 (three) times daily.    Marland Kitchen LODRANE 24D PO Oral Take 1 tablet by mouth every 12 (twelve) hours.    . BUMETANIDE 2 MG PO TABS Oral Take 2 mg by mouth daily.    . CHLORHEXIDINE GLUCONATE 0.12 % MT SOLN Mouth/Throat Use as directed 15 mLs in the mouth or throat daily.    Marland Kitchen GLIPIZIDE 5 MG PO TABS Oral Take 5 mg by mouth daily.    . IPRATROPIUM BROMIDE HFA 17 MCG/ACT IN AERS Inhalation Inhale 2 puffs into the lungs every 6 (six) hours as needed. For shortness of breath    . LACTULOSE 10 GM/15ML PO SOLN Oral Take 20 g by mouth daily.    Marland Kitchen LISINOPRIL 10 MG PO TABS Oral Take 10 mg by mouth daily.    . LUBIPROSTONE 24 MCG PO CAPS Oral Take 24 mcg by mouth daily.    Marland Kitchen METFORMIN HCL 1000 MG PO TABS Oral Take 1,000 mg by mouth daily.    . NORETHINDRONE 0.35 MG PO TABS Oral Take 1 tablet by mouth daily.    . OXYCODONE HCL ER 10 MG PO TB12 Oral Take 10 mg by mouth 2 (two) times daily as needed. For pain    . PREDNISONE 5  MG PO TABS Oral Take 5 mg by mouth daily.    Marland Kitchen SIMVASTATIN 20 MG PO TABS Oral Take 20 mg by mouth every evening.    Marland Kitchen SPIRONOLACTONE 25 MG PO TABS Oral Take 25 mg by mouth daily.      BP 154/89  Pulse 98  Temp 98.6 F (37 C) (Oral)  Resp 20  SpO2 100%  Physical Exam  Constitutional: She appears well-developed and well-nourished.  Eyes: Pupils are equal, round, and reactive to light.  Neck: Normal range of motion.  Cardiovascular: Normal rate.   Pulmonary/Chest: Effort normal.  Musculoskeletal: She exhibits edema and tenderness.       Feet:    ED Course  Procedures (including critical care time)  Labs Reviewed - No data to display No results found.   1. Bee sting reaction       MDM   Bee sting with local reaction will treat with 1 dose of Prednisone and ice therapy        Arman Filter, NP 02/07/12 2321  Arman Filter, NP 02/07/12 2321

## 2012-02-07 NOTE — ED Notes (Signed)
Minimal swelling noted to foot at site.

## 2012-02-09 NOTE — ED Provider Notes (Signed)
Medical screening examination/treatment/procedure(s) were performed by non-physician practitioner and as supervising physician I was immediately available for consultation/collaboration.    Vida Roller, MD 02/09/12 1539

## 2012-04-24 LAB — HM PAP SMEAR: HM Pap smear: NORMAL

## 2012-08-18 ENCOUNTER — Emergency Department (HOSPITAL_COMMUNITY)
Admission: EM | Admit: 2012-08-18 | Discharge: 2012-08-18 | Disposition: A | Discharge status: Home / Self Care | Payer: Medicare Other | Attending: Emergency Medicine | Admitting: Emergency Medicine

## 2012-08-18 ENCOUNTER — Encounter (HOSPITAL_COMMUNITY): Payer: Self-pay | Admitting: Emergency Medicine

## 2012-08-18 DIAGNOSIS — J4489 Other specified chronic obstructive pulmonary disease: Secondary | ICD-10-CM | POA: Insufficient documentation

## 2012-08-18 DIAGNOSIS — R220 Localized swelling, mass and lump, head: Secondary | ICD-10-CM | POA: Insufficient documentation

## 2012-08-18 DIAGNOSIS — J3489 Other specified disorders of nose and nasal sinuses: Secondary | ICD-10-CM | POA: Insufficient documentation

## 2012-08-18 DIAGNOSIS — J449 Chronic obstructive pulmonary disease, unspecified: Secondary | ICD-10-CM | POA: Insufficient documentation

## 2012-08-18 DIAGNOSIS — F17200 Nicotine dependence, unspecified, uncomplicated: Secondary | ICD-10-CM | POA: Insufficient documentation

## 2012-08-18 DIAGNOSIS — Z79899 Other long term (current) drug therapy: Secondary | ICD-10-CM | POA: Insufficient documentation

## 2012-08-18 DIAGNOSIS — F3289 Other specified depressive episodes: Secondary | ICD-10-CM | POA: Insufficient documentation

## 2012-08-18 DIAGNOSIS — J45909 Unspecified asthma, uncomplicated: Secondary | ICD-10-CM | POA: Insufficient documentation

## 2012-08-18 DIAGNOSIS — Z8739 Personal history of other diseases of the musculoskeletal system and connective tissue: Secondary | ICD-10-CM | POA: Insufficient documentation

## 2012-08-18 DIAGNOSIS — Z8719 Personal history of other diseases of the digestive system: Secondary | ICD-10-CM | POA: Insufficient documentation

## 2012-08-18 DIAGNOSIS — F329 Major depressive disorder, single episode, unspecified: Secondary | ICD-10-CM | POA: Insufficient documentation

## 2012-08-18 DIAGNOSIS — L0201 Cutaneous abscess of face: Secondary | ICD-10-CM | POA: Insufficient documentation

## 2012-08-18 DIAGNOSIS — I10 Essential (primary) hypertension: Secondary | ICD-10-CM | POA: Insufficient documentation

## 2012-08-18 DIAGNOSIS — L03211 Cellulitis of face: Secondary | ICD-10-CM | POA: Insufficient documentation

## 2012-08-18 DIAGNOSIS — Z862 Personal history of diseases of the blood and blood-forming organs and certain disorders involving the immune mechanism: Secondary | ICD-10-CM | POA: Insufficient documentation

## 2012-08-18 DIAGNOSIS — I2510 Atherosclerotic heart disease of native coronary artery without angina pectoris: Secondary | ICD-10-CM | POA: Insufficient documentation

## 2012-08-18 DIAGNOSIS — J340 Abscess, furuncle and carbuncle of nose: Secondary | ICD-10-CM

## 2012-08-18 DIAGNOSIS — Z8639 Personal history of other endocrine, nutritional and metabolic disease: Secondary | ICD-10-CM | POA: Insufficient documentation

## 2012-08-18 HISTORY — DX: Depression, unspecified: F32.A

## 2012-08-18 HISTORY — DX: Major depressive disorder, single episode, unspecified: F32.9

## 2012-08-18 LAB — CBC WITH DIFFERENTIAL/PLATELET
Basophils Absolute: 0 10*3/uL (ref 0.0–0.1)
Basophils Relative: 0 % (ref 0–1)
Eosinophils Absolute: 0.2 K/uL (ref 0.0–0.7)
Eosinophils Relative: 2 % (ref 0–5)
HCT: 41.6 % (ref 36.0–46.0)
Hemoglobin: 14 g/dL (ref 12.0–15.0)
Lymphocytes Relative: 34 % (ref 12–46)
Lymphs Abs: 3.3 10*3/uL (ref 0.7–4.0)
MCH: 27.2 pg (ref 26.0–34.0)
MCHC: 33.7 g/dL (ref 30.0–36.0)
MCV: 80.9 fL (ref 78.0–100.0)
Monocytes Absolute: 0.5 10*3/uL (ref 0.1–1.0)
Monocytes Relative: 5 % (ref 3–12)
Neutro Abs: 5.8 10*3/uL (ref 1.7–7.7)
Neutrophils Relative %: 59 % (ref 43–77)
Platelets: 261 K/uL (ref 150–400)
RBC: 5.14 MIL/uL — ABNORMAL HIGH (ref 3.87–5.11)
RDW: 14 % (ref 11.5–15.5)
WBC: 9.8 10*3/uL (ref 4.0–10.5)

## 2012-08-18 LAB — BASIC METABOLIC PANEL WITH GFR
BUN: 10 mg/dL (ref 6–23)
Calcium: 9.6 mg/dL (ref 8.4–10.5)
GFR calc Af Amer: >90 mL/min (ref >90)
GFR calc non Af Amer: >90 mL/min (ref >90)
Glucose, Bld: 171 mg/dL — ABNORMAL HIGH (ref 70–99)
Potassium: 3.8 meq/L (ref 3.5–5.1)
Sodium: 139 meq/L (ref 135–145)

## 2012-08-18 LAB — GLUCOSE, CAPILLARY: Glucose-Capillary: 160 mg/dL — ABNORMAL HIGH (ref 70–99)

## 2012-08-18 LAB — BASIC METABOLIC PANEL
CO2: 28 mEq/L (ref 19–32)
Chloride: 104 mEq/L (ref 96–112)
Creatinine, Ser: 0.59 mg/dL (ref 0.50–1.10)

## 2012-08-18 MED ORDER — ACETAMINOPHEN 325 MG PO TABS
650.0000 mg | ORAL_TABLET | Freq: Once | ORAL | Status: AC
  Administered 2012-08-18: 650 mg via ORAL
  Filled 2012-08-18: qty 2

## 2012-08-18 MED ORDER — CLINDAMYCIN HCL 150 MG PO CAPS: ORAL_CAPSULE | ORAL | Status: DC

## 2012-08-18 NOTE — ED Notes (Signed)
Went for check up 6 days ago primary Doctor. Blood draw taken at that time received phone 4 days ago Blood sugar "HIGH" told patient to restart taking her metformin 1000mg  bid.  Since then taking everyday.  Concerned for high blood sugar and boil under nose woke up with night sweats. Blood sugar today 208 at home.

## 2012-08-18 NOTE — ED Notes (Signed)
Melissa, RN, given report

## 2012-08-18 NOTE — ED Notes (Signed)
Pt. Stated, I've had a headache.

## 2012-08-18 NOTE — ED Notes (Signed)
Report given to Melissa, RN

## 2012-08-18 NOTE — ED Notes (Signed)
Family at bedside. 

## 2012-08-19 NOTE — ED Provider Notes (Signed)
History     CSN: [id number]  Arrival date & time 08/18/12  1009   First MD Initiated Contact with Patient 08/18/12 1105      Chief Complaint  Patient presents with  . Hyperglycemia    (Consider location/radiation/quality/duration/timing/severity/associated sxs/prior treatment) HPI Comments: 52 y.o. female presents today complaining of high blood sugar after taking it this morning and getting a reading of 208. PMHx significant for DM2 for many years. Pt had stopped taking metformin for several months, but restarted after seeing her PCP for the first time in months and started her metformin this morning at 1000mg  BID.  Pt most concerned about abscess of right nare which she says comes and goes as her sugars go up and down.  Patient is a 52 y.o. female presenting with abscess. The history is provided by the patient.  Abscess  This is a recurrent problem. The current episode started more than one week ago. The onset was gradual. The problem occurs frequently. The problem has been gradually worsening. Current severity: right nare, mild discomfort. The abscess is characterized by redness and swelling. It is unknown (recurring) what she was exposed to. Pertinent negatives include no anorexia, no decrease in physical activity, not sleeping less, not drinking less, no fever, no fussiness, not sleeping more, no diarrhea, no vomiting, no congestion, no rhinorrhea, no sore throat, no decreased responsiveness and no cough. There were no sick contacts. Recently, medical care has been given by the PCP.    Past Medical History  Diagnosis Date  . Crohn's disease     hx of crohn's dz  . COPD (chronic obstructive pulmonary disease)   . Coronary artery disease   . Hypertension   . Diabetes mellitus   . Thyroid disease     hypothyroidism  . Arthritis   . Asthma   . AC (acromioclavicular) joint bone spurs     neck  . Depressed     Past Surgical History  Procedure Date  . Metatarsal reconstruction  1993    No family history on file.  History  Substance Use Topics  . Smoking status: Current Every Day Smoker -- 1.0 packs/day  . Smokeless tobacco: Not on file  . Alcohol Use: No    OB History    Grav Para Term Preterm Abortions TAB SAB Ect Mult Living                  Review of Systems  Constitutional: Negative for fever, diaphoresis and decreased responsiveness.  HENT: Negative for congestion, sore throat, rhinorrhea, neck pain and neck stiffness.        Pt complaining of 0.25 cm abscess to right nare   Eyes: Negative for visual disturbance.  Respiratory: Negative for apnea, cough, chest tightness and shortness of breath.   Cardiovascular: Negative for chest pain and palpitations.  Gastrointestinal: Negative for nausea, vomiting, diarrhea, constipation and anorexia.  Genitourinary: Negative for dysuria.  Musculoskeletal: Negative for gait problem.  Skin: Negative for rash.  Neurological: Negative for dizziness, weakness, light-headedness, numbness and headaches.    Allergies  Avelox and Tetracyclines & related  Home Medications   Current Outpatient Rx  Name  Route  Sig  Dispense  Refill  . ALBUTEROL SULFATE (2.5 MG/3ML) 0.083% IN NEBU   Nebulization   Take 2.5 mg by nebulization every 6 (six) hours as needed. For shortness of breath         . ALPRAZOLAM ER 1 MG PO TB24   Oral   Take 1  mg by mouth 3 (three) times daily.         . BUMETANIDE 2 MG PO TABS   Oral   Take 2 mg by mouth daily.         . CHLORHEXIDINE GLUCONATE 0.12 % MT SOLN   Mouth/Throat   Use as directed 15 mLs in the mouth or throat daily.         . IPRATROPIUM BROMIDE HFA 17 MCG/ACT IN AERS   Inhalation   Inhale 2 puffs into the lungs every 6 (six) hours as needed. For shortness of breath         . LACTULOSE 10 GM/15ML PO SOLN   Oral   Take 20 g by mouth daily as needed. For constipation         . LISINOPRIL 10 MG PO TABS   Oral   Take 10 mg by mouth daily.         .  LUBIPROSTONE 24 MCG PO CAPS   Oral   Take 24 mcg by mouth daily.         Marland Kitchen METFORMIN HCL 1000 MG PO TABS   Oral   Take 1,000 mg by mouth daily.         . NORETHINDRONE 0.35 MG PO TABS   Oral   Take 1 tablet by mouth daily.         . OXYCODONE HCL ER 10 MG PO TB12   Oral   Take 10 mg by mouth every 4 (four) hours as needed. For pain         . PREDNISONE 5 MG PO TABS   Oral   Take 5 mg by mouth daily.         Marland Kitchen SIMVASTATIN 20 MG PO TABS   Oral   Take 20 mg by mouth every evening.         Marland Kitchen SPIRONOLACTONE 25 MG PO TABS   Oral   Take 25 mg by mouth daily.         Marland Kitchen VILAZODONE HCL 10 MG PO TABS   Oral   Take 10 mg by mouth daily.         Marland Kitchen CLINDAMYCIN HCL 150 MG PO CAPS      Take 2 tablets three times daily for 10 days.   60 capsule   0     BP 129/84  Pulse 78  Temp 98.6 F (37 C) (Oral)  Resp 20  SpO2 100%  Physical Exam  Nursing note and vitals reviewed. Constitutional: She is oriented to person, place, and time. She appears well-developed and well-nourished. No distress.  HENT:  Head: Normocephalic and atraumatic.       Abscess in right nare lateral to septum 0.5cm  Eyes: EOM are normal. Pupils are equal, round, and reactive to light.  Neck: Normal range of motion. Neck supple.       No meningeal signs  Cardiovascular: Normal rate, regular rhythm and normal heart sounds.  Exam reveals no gallop and no friction rub.   No murmur heard. Pulmonary/Chest: Effort normal and breath sounds normal. No respiratory distress. She has no wheezes. She has no rales. She exhibits no tenderness.  Abdominal: Soft. Bowel sounds are normal. She exhibits no distension. There is no tenderness. There is no rebound and no guarding.  Musculoskeletal: Normal range of motion. She exhibits no edema and no tenderness.  Neurological: She is alert and oriented to person, place, and time. No cranial nerve deficit.  Skin:  Skin is warm and dry. She is not diaphoretic. No  erythema.    ED Course  Procedures (including critical care time)  Labs Reviewed  CBC WITH DIFFERENTIAL - Abnormal; Notable for the following:    RBC 5.14 (*)     All other components within normal limits  BASIC METABOLIC PANEL - Abnormal; Notable for the following:    Glucose, Bld 171 (*)     All other components within normal limits  GLUCOSE, CAPILLARY - Abnormal; Notable for the following:    Glucose-Capillary 160 (*)     All other components within normal limits  LAB REPORT - SCANNED   No results found. Results for orders placed during the hospital encounter of 08/18/12  CBC WITH DIFFERENTIAL      Component Value Range   WBC 9.8  4.0 - 10.5 K/uL   RBC 5.14 (*) 3.87 - 5.11 MIL/uL   Hemoglobin 14.0  12.0 - 15.0 g/dL   HCT 14.7  82.9 - 56.2 %   MCV 80.9  78.0 - 100.0 fL   MCH 27.2  26.0 - 34.0 pg   MCHC 33.7  30.0 - 36.0 g/dL   RDW 13.0  86.5 - 78.4 %   Platelets 261  150 - 400 K/uL   Neutrophils Relative 59  43 - 77 %   Neutro Abs 5.8  1.7 - 7.7 K/uL   Lymphocytes Relative 34  12 - 46 %   Lymphs Abs 3.3  0.7 - 4.0 K/uL   Monocytes Relative 5  3 - 12 %   Monocytes Absolute 0.5  0.1 - 1.0 K/uL   Eosinophils Relative 2  0 - 5 %   Eosinophils Absolute 0.2  0.0 - 0.7 K/uL   Basophils Relative 0  0 - 1 %   Basophils Absolute 0.0  0.0 - 0.1 K/uL  BASIC METABOLIC PANEL      Component Value Range   Sodium 139  135 - 145 mEq/L   Potassium 3.8  3.5 - 5.1 mEq/L   Chloride 104  96 - 112 mEq/L   CO2 28  19 - 32 mEq/L   Glucose, Bld 171 (*) 70 - 99 mg/dL   BUN 10  6 - 23 mg/dL   Creatinine, Ser 6.96  0.50 - 1.10 mg/dL   Calcium 9.6  8.4 - 29.5 mg/dL   GFR calc non Af Amer >90  >90 mL/min   GFR calc Af Amer >90  >90 mL/min  GLUCOSE, CAPILLARY      Component Value Range   Glucose-Capillary 160 (*) 70 - 99 mg/dL     1. Nasal abscess       MDM  Pt complained of headache during her ED course, treated with Tylenol. CBG returned value of 160. CMC/BMP unremarkable  excepting glucose of 171. Pt asymptomatic.  At this time there does not appear to be any evidence of an acute emergency medical condition and the patient appears stable for discharge with appropriate outpatient follow up. Pt believes she was prescribed clindamycin the last time she had similar abscess with success. Provided prescription for same on discharge. Diagnosis was discussed with patient who verbalizes understanding and is agreeable to discharge. Pt followed by Dr. Oletta Lamas as well who is in agreement with the plan.  Glade Nurse, PA-C 08/19/12 1159

## 2012-08-21 NOTE — ED Provider Notes (Signed)
Medical screening examination/treatment/procedure(s) were conducted as a shared visit with non-physician practitioner(s) and myself.  I personally evaluated the patient during the encounter  Pt appears well.  Pt with h/o multiple abscess in the past, lingering lesion just at opening of right nares.  Minimal fluctuance, minimal pain. Abc given and prescribed. No fever, well appearing.  Not in DKA.  Oral abx provided and reassurance, can follow up with PCP.  Gavin Pound. Donzel Romack, MD 08/21/12 1433

## 2012-10-24 ENCOUNTER — Other Ambulatory Visit: Payer: Self-pay | Admitting: *Deleted

## 2012-10-24 MED ORDER — OXYCODONE HCL 10 MG PO TABS: ORAL_TABLET | ORAL | Status: DC

## 2012-10-24 MED ORDER — ALPRAZOLAM 1 MG PO TABS: ORAL_TABLET | ORAL | Status: DC

## 2012-10-31 ENCOUNTER — Other Ambulatory Visit: Payer: Self-pay | Admitting: *Deleted

## 2012-10-31 ENCOUNTER — Other Ambulatory Visit: Payer: Medicare Other

## 2012-10-31 DIAGNOSIS — E119 Type 2 diabetes mellitus without complications: Secondary | ICD-10-CM

## 2012-10-31 DIAGNOSIS — E785 Hyperlipidemia, unspecified: Secondary | ICD-10-CM

## 2012-10-31 DIAGNOSIS — I10 Essential (primary) hypertension: Secondary | ICD-10-CM

## 2012-10-31 DIAGNOSIS — E039 Hypothyroidism, unspecified: Secondary | ICD-10-CM

## 2012-11-01 LAB — CBC WITH DIFFERENTIAL/PLATELET
Basophils Absolute: 0 10*3/uL (ref 0.0–0.2)
Eosinophils Absolute: 0.3 10*3/uL (ref 0.0–0.4)
Hemoglobin: 14.1 g/dL (ref 11.1–15.9)
Immature Granulocytes: 0 % (ref 0–2)
Lymphocytes Absolute: 3.4 10*3/uL — ABNORMAL HIGH (ref 0.7–3.1)
Lymphs: 36 % (ref 14–46)
MCH: 27.2 pg (ref 26.6–33.0)
MCHC: 32.6 g/dL (ref 31.5–35.7)
Monocytes: 5 % (ref 4–12)
Neutrophils Absolute: 5.3 10*3/uL (ref 1.4–7.0)
RDW: 14.6 % (ref 12.3–15.4)

## 2012-11-01 LAB — COMPREHENSIVE METABOLIC PANEL
ALT: 22 IU/L (ref 0–32)
AST: 13 IU/L (ref 0–40)
Albumin/Globulin Ratio: 1.7 (ref 1.1–2.5)
Alkaline Phosphatase: 87 IU/L (ref 39–117)
BUN/Creatinine Ratio: 17 (ref 9–23)
Creatinine, Ser: 0.64 mg/dL (ref 0.57–1.00)
GFR calc Af Amer: 118 mL/min/{1.73_m2} (ref >59)
GFR calc non Af Amer: 102 mL/min/{1.73_m2} (ref >59)
Globulin, Total: 2.4 g/dL (ref 1.5–4.5)
Potassium: 5.3 mmol/L — ABNORMAL HIGH (ref 3.5–5.2)
Sodium: 141 mmol/L (ref 134–144)
Total Bilirubin: 0.1 mg/dL (ref 0.0–1.2)

## 2012-11-01 LAB — HEMOGLOBIN A1C
Est. average glucose Bld gHb Est-mCnc: 275 mg/dL
Hgb A1c MFr Bld: 11.2 % — ABNORMAL HIGH (ref 4.8–5.6)

## 2012-11-07 ENCOUNTER — Ambulatory Visit (INDEPENDENT_AMBULATORY_CARE_PROVIDER_SITE_OTHER): Payer: Medicare Other | Admitting: Nurse Practitioner

## 2012-11-07 ENCOUNTER — Encounter: Payer: Self-pay | Admitting: Geriatric Medicine

## 2012-11-07 ENCOUNTER — Encounter: Payer: Self-pay | Admitting: Nurse Practitioner

## 2012-11-07 VITALS — BP 116/66 | HR 74 | Temp 97.6°F | Resp 16 | Ht 62.0 in | Wt 187.0 lb

## 2012-11-07 DIAGNOSIS — K219 Gastro-esophageal reflux disease without esophagitis: Secondary | ICD-10-CM

## 2012-11-07 DIAGNOSIS — I10 Essential (primary) hypertension: Secondary | ICD-10-CM

## 2012-11-07 DIAGNOSIS — F329 Major depressive disorder, single episode, unspecified: Secondary | ICD-10-CM

## 2012-11-07 DIAGNOSIS — E1140 Type 2 diabetes mellitus with diabetic neuropathy, unspecified: Secondary | ICD-10-CM

## 2012-11-07 DIAGNOSIS — E1165 Type 2 diabetes mellitus with hyperglycemia: Secondary | ICD-10-CM

## 2012-11-07 DIAGNOSIS — K056 Periodontal disease, unspecified: Secondary | ICD-10-CM | POA: Insufficient documentation

## 2012-11-07 DIAGNOSIS — K5090 Crohn's disease, unspecified, without complications: Secondary | ICD-10-CM

## 2012-11-07 DIAGNOSIS — E1142 Type 2 diabetes mellitus with diabetic polyneuropathy: Secondary | ICD-10-CM

## 2012-11-07 DIAGNOSIS — E1149 Type 2 diabetes mellitus with other diabetic neurological complication: Secondary | ICD-10-CM

## 2012-11-07 DIAGNOSIS — K589 Irritable bowel syndrome without diarrhea: Secondary | ICD-10-CM

## 2012-11-07 MED ORDER — GLIPIZIDE ER 5 MG PO TB24: 5.0000 mg | ORAL_TABLET | Freq: Every day | ORAL | Status: DC

## 2012-11-07 NOTE — Patient Instructions (Addendum)
Start taking glipizide 5 mg daily--Cont taking blood sugars will follow up in 6 weeks May take zantac twice daily to help with indigestion     Diabetes, Type 2 Diabetes is a long-lasting (chronic) disease. In type 2 diabetes, the pancreas does not make enough insulin (a hormone), and the body does not respond normally to the insulin that is made. This type of diabetes was also previously called adult-onset diabetes. It usually occurs after the age of 50, but it can occur at any age.  CAUSES  Type 2 diabetes happens because the pancreasis not making enough insulin or your body has trouble using the insulin that your pancreas does make properly. SYMPTOMS   Drinking more than usual.  Urinating more than usual.  Blurred vision.  Dry, itchy skin.  Frequent infections.  Feeling more tired than usual (fatigue). DIAGNOSIS The diagnosis of type 2 diabetes is usually made by one of the following tests:  Fasting blood glucose test. You will not eat for at least 8 hours and then take a blood test.  Random blood glucose test. Your blood glucose (sugar) is checked at any time of the day regardless of when you ate.  Oral glucose tolerance test (OGTT). Your blood glucose is measured after you have not eaten (fasted) and then after you drink a glucose containing beverage. TREATMENT   Healthy eating.  Exercise.  Medicine, if needed.  Monitoring blood glucose.  Seeing your caregiver regularly. HOME CARE INSTRUCTIONS   Check your blood glucose at least once a day. More frequent monitoring may be necessary, depending on your medicines and on how well your diabetes is controlled. Your caregiver will advise you.  Take your medicine as directed by your caregiver.  Do not smoke.  Make wise food choices. Ask your caregiver for information. Weight loss can improve your diabetes.  Learn about low blood glucose (hypoglycemia) and how to treat it.  Get your eyes checked regularly.  Have a  yearly physical exam. Have your blood pressure checked and your blood and urine tested.  Wear a pendant or bracelet saying that you have diabetes.  Check your feet every night for cuts, sores, blisters, and redness. Let your caregiver know if you have any problems. SEEK MEDICAL CARE IF:   You have problems keeping your blood glucose in target range.  You have problems with your medicines.  You have symptoms of an illness that do not improve after 24 hours.  You have a sore or wound that is not healing.  You notice a change in vision or a new problem with your vision.  You have a fever. MAKE SURE YOU:  Understand these instructions.  Will watch your condition.  Will get help right away if you are not doing well or get worse. Document Released: 07/03/2005 Document Revised: 09/25/2011 Document Reviewed: 12/19/2010 [facility] Patient Information 2013 [place], [place].

## 2012-11-07 NOTE — Progress Notes (Signed)
Patient ID: [name] [name], female   DOB: Jul 01, 1961, 52 y.o.   MRN: [id number]   Allergies  Allergen Reactions  . Avelox (Moxifloxacin Hcl In Nacl) Swelling  . Chantix (Varenicline)   . Imipramine Hcl   . Tetracyclines & Related Swelling    Chief Complaint  Patient presents with  . Medical Managment of Chronic Issues    HPI: Patient is a 52 y.o. female seen in the office today for routine follow up Blood sugars running in the 200s she is taking -- has glipizide ER 5mg  at home but quit taking these last year. taking metformin 1000mg  BID    Has been taking vibrid and has better mood-- out in the yard participating with grandchildren more.   Review of Systems:  Review of Systems  Constitutional: Positive for malaise/fatigue. Negative for fever, chills and weight loss.  Respiratory: Positive for shortness of breath. Negative for wheezing.   Cardiovascular: Positive for palpitations. Negative for chest pain.  Gastrointestinal: Positive for heartburn (imrpoved on zantac).  Musculoskeletal: Positive for myalgias and back pain.  Skin: Negative for itching and rash.  Neurological: Positive for weakness.     Past Medical History  Diagnosis Date  . Crohn's disease     hx of crohn's dz  . COPD (chronic obstructive pulmonary disease)   . Coronary artery disease   . Hypertension   . Diabetes mellitus   . Thyroid disease     hypothyroidism  . Arthritis   . Asthma   . AC (acromioclavicular) joint bone spurs     neck  . Depressed   . Nausea alone   . Unspecified gingival and periodontal disease   . Palpitations   . Allergic rhinitis due to pollen   . Methicillin resistant Staphylococcus aureus septicemia   . Acute bronchitis   . Type II or unspecified type diabetes mellitus without mention of complication, uncontrolled   . Carbuncle and furuncle of trunk   . Abdominal pain, unspecified site   . Sacroiliitis, not elsewhere classified   . Hyperventilation   . Flaccid  hemiplegia affecting dominant side   . Pain in joint, pelvic region and thigh   . Cough   . Disturbance of skin sensation   . Herpes zoster with unspecified nervous system complication   . Chest pain, unspecified   . Nonspecific abnormal electrocardiogram (ECG) (EKG)   . Candidiasis of mouth   . Hyperlipidemia   . Tobacco use disorder   . Regional enteritis of large intestine   . Osteoarthrosis, unspecified whether generalized or localized, unspecified site   . Intrinsic asthma, unspecified   . Lumbago   . Blood in stool   . Unspecified nontoxic nodular goiter   . Anxiety   . Edema   . Abdominal pain, right lower quadrant   . Dysmenorrhea   . Unspecified urinary incontinence   . Regional enteritis of unspecified site   . Type II or unspecified type diabetes mellitus without mention of complication, not stated as uncontrolled   . GERD (gastroesophageal reflux disease)   . Other malaise and fatigue   . Encounter for long-term (current) use of other medications   . Unspecified constipation   . Irritable bowel syndrome   . Diaphragmatic hernia without mention of obstruction or gangrene    Past Surgical History  Procedure Laterality Date  . Metatarsal reconstruction  1993  . Tubalization  1982  . Right foot surgery in 1993 twice    . Right foot surgery in 1995  Social History:   reports that she has been smoking.  She does not have any smokeless tobacco history on file. She reports that she does not drink alcohol or use illicit drugs.  Family History  Problem Relation Age of Onset  . Stroke Mother     Medications: Patient's Medications  New Prescriptions   GLIPIZIDE (GLUCOTROL XL) 5 MG 24 HR TABLET    Take 1 tablet (5 mg total) by mouth daily.  Previous Medications   ALBUTEROL (PROVENTIL) (2.5 MG/3ML) 0.083% NEBULIZER SOLUTION    Take 2.5 mg by nebulization every 6 (six) hours as needed. For shortness of breath   ALPRAZOLAM (XANAX) 1 MG TABLET    Take one tablet three  times a day for nerves and sleep at night time   BUMETANIDE (BUMEX) 2 MG TABLET    Take 2 mg by mouth daily.   CHLORHEXIDINE (PERIDEX) 0.12 % SOLUTION    Use as directed 15 mLs in the mouth or throat daily.   FLUCONAZOLE (DIFLUCAN) 100 MG TABLET       LACTULOSE (CHRONULAC) 10 GM/15ML SOLUTION    Take 20 g by mouth daily as needed. For constipation   LISINOPRIL (PRINIVIL,ZESTRIL) 10 MG TABLET    Take 10 mg by mouth daily.   LUBIPROSTONE (AMITIZA) 24 MCG CAPSULE    Take 24 mcg by mouth daily.   METFORMIN (GLUCOPHAGE) 1000 MG TABLET    Take 1,000 mg by mouth daily. Take one tablet twice a day for diabetes 250.00   OXYCODONE HCL 10 MG TABS    Take one tablet up to 6 times as needed to control pain   PREDNISONE (DELTASONE) 5 MG TABLET    Take 5 mg by mouth daily.   PROMETHAZINE (PHENERGAN) 12.5 MG TABLET       SIMVASTATIN (ZOCOR) 20 MG TABLET    Take 20 mg by mouth every evening.   SPIRONOLACTONE (ALDACTONE) 25 MG TABLET    Take 25 mg by mouth daily.   VILAZODONE HCL (VIIBRYD) 40 MG TABS    Take by mouth daily. Take one tablet once a day for depression  Modified Medications   No medications on file  Discontinued Medications   ALPRAZOLAM (XANAX XR) 1 MG 24 HR TABLET    Take 1 mg by mouth 3 (three) times daily.   CLINDAMYCIN (CLEOCIN) 150 MG CAPSULE    Take 2 tablets three times daily for 10 days.   IPRATROPIUM (ATROVENT HFA) 17 MCG/ACT INHALER    Inhale 2 puffs into the lungs every 6 (six) hours as needed. For shortness of breath   NORETHINDRONE (CAMILA) 0.35 MG TABLET    Take 1 tablet by mouth daily.   OXYCODONE (OXYCONTIN) 10 MG 12 HR TABLET    Take 10 mg by mouth every 4 (four) hours as needed. For pain   VILAZODONE HCL (VIIBRYD) 10 MG TABS    Take 10 mg by mouth daily.     Physical Exam:  Filed Vitals:   11/07/12 0951  BP: 116/66  Pulse: 74  Temp: 97.6 F (36.4 C)  Resp: 16  Height: 5\' 2"  (1.575 m)  Weight: 187 lb (84.823 kg)   Physical Exam  Constitutional: She is oriented to  person, place, and time. She appears well-developed and well-nourished. No distress.  Cardiovascular: Normal rate, regular rhythm and normal heart sounds.   Pulmonary/Chest: Effort normal and breath sounds normal.  Abdominal: Soft. Bowel sounds are normal.  Musculoskeletal: Normal range of motion.  Neurological: She is alert  and oriented to person, place, and time. No sensory deficit.  Skin: Skin is warm and dry. She is not diaphoretic.  Psychiatric: She has a normal mood and affect.      Labs reviewed: Basic Metabolic Panel:  Recent Labs  40/98/11 1041 10/31/12 1144  NA 139 141  K 3.8 5.3*  CL 104 101  CO2 28 26  GLUCOSE 171* 201*  BUN 10 11  CREATININE 0.59 0.64  CALCIUM 9.6 10.1   Liver Function Tests:  Recent Labs  10/31/12 1144  AST 13  ALT 22  ALKPHOS 87  BILITOT 0.1  PROT 6.5   No results found for this basename: LIPASE, AMYLASE,  in the last 8760 hours No results found for this basename: AMMONIA,  in the last 8760 hours CBC:  Recent Labs  08/18/12 1041 10/31/12 1144  WBC 9.8 9.5  NEUTROABS 5.8 5.3  HGB 14.0 14.1  HCT 41.6 43.3  MCV 80.9 83  PLT 261  --       Assessment/Plan Depressed Improved on viibryd- samples given   Unspecified gingival and periodontal disease Encouraged to see a dentist. Cont mouthwash and oral care.   Crohn's disease Stable at this time.   GERD (gastroesophageal reflux disease) May increase zantac to twice daily to help with GERD   Hypertension Patient is stable; continue current regimen. Will monitor and make changes as necessary.   Type II or unspecified type diabetes mellitus with neurological manifestations, uncontrolled(250.62) Most recentl a1c of 11.2 pt reports she is taking her metformin twice daily as prescribed. Will add glipizide 5mg  daily to current medications. encouraged dietary changes as well as any increase in activity that can be tolerated.   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

## 2012-11-12 ENCOUNTER — Encounter: Payer: Self-pay | Admitting: Nurse Practitioner

## 2012-11-12 DIAGNOSIS — E1149 Type 2 diabetes mellitus with other diabetic neurological complication: Secondary | ICD-10-CM | POA: Insufficient documentation

## 2012-11-12 DIAGNOSIS — E1140 Type 2 diabetes mellitus with diabetic neuropathy, unspecified: Secondary | ICD-10-CM | POA: Insufficient documentation

## 2012-11-12 NOTE — Assessment & Plan Note (Signed)
Improved on viibryd- samples given

## 2012-11-12 NOTE — Assessment & Plan Note (Signed)
Ongoing- does not wish to take any medications for this at this time

## 2012-11-12 NOTE — Assessment & Plan Note (Signed)
Stable at this time 

## 2012-11-12 NOTE — Assessment & Plan Note (Addendum)
Most recentl a1c of 11.2 pt reports she is taking her metformin twice daily as prescribed. Will add glipizide 5mg  daily to current medications. encouraged dietary changes as well as any increase in activity that can be tolerated.

## 2012-11-12 NOTE — Assessment & Plan Note (Addendum)
May increase zantac to twice daily to help with GERD

## 2012-11-12 NOTE — Assessment & Plan Note (Signed)
Patient is stable; continue current regimen. Will monitor and make changes as necessary.  

## 2012-11-12 NOTE — Assessment & Plan Note (Signed)
Encouraged to see a dentist. Cont mouthwash and oral care.

## 2012-11-22 ENCOUNTER — Other Ambulatory Visit: Payer: Self-pay | Admitting: *Deleted

## 2012-11-22 MED ORDER — METFORMIN HCL 1000 MG PO TABS: ORAL_TABLET | ORAL | Status: DC

## 2012-11-22 MED ORDER — OXYCODONE HCL 10 MG PO TABS: ORAL_TABLET | ORAL | Status: DC

## 2012-11-22 MED ORDER — METFORMIN HCL 1000 MG PO TABS: 1000.0000 mg | ORAL_TABLET | Freq: Every day | ORAL | Status: DC

## 2012-12-23 ENCOUNTER — Other Ambulatory Visit: Payer: Self-pay | Admitting: *Deleted

## 2012-12-23 MED ORDER — OXYCODONE HCL 10 MG PO TABS: ORAL_TABLET | ORAL | Status: DC

## 2012-12-24 ENCOUNTER — Encounter: Payer: Self-pay | Admitting: Geriatric Medicine

## 2012-12-25 ENCOUNTER — Ambulatory Visit (INDEPENDENT_AMBULATORY_CARE_PROVIDER_SITE_OTHER): Payer: Medicare Other | Admitting: Nurse Practitioner

## 2012-12-25 ENCOUNTER — Encounter: Payer: Self-pay | Admitting: Nurse Practitioner

## 2012-12-25 VITALS — BP 130/82 | HR 98 | Temp 98.7°F | Resp 16 | Ht 62.0 in | Wt 194.0 lb

## 2012-12-25 DIAGNOSIS — E1149 Type 2 diabetes mellitus with other diabetic neurological complication: Secondary | ICD-10-CM

## 2012-12-25 DIAGNOSIS — M1710 Unilateral primary osteoarthritis, unspecified knee: Secondary | ICD-10-CM

## 2012-12-25 DIAGNOSIS — R5381 Other malaise: Secondary | ICD-10-CM

## 2012-12-25 DIAGNOSIS — F329 Major depressive disorder, single episode, unspecified: Secondary | ICD-10-CM

## 2012-12-25 DIAGNOSIS — R531 Weakness: Secondary | ICD-10-CM | POA: Insufficient documentation

## 2012-12-25 MED ORDER — VILAZODONE HCL 40 MG PO TABS: 40.0000 mg | ORAL_TABLET | Freq: Every day | ORAL | Status: DC

## 2012-12-25 NOTE — Progress Notes (Signed)
Patient ID: [name] [name], female   DOB: [birth year], 52 y.o.   MRN: [id number]   Allergies  Allergen Reactions  . Avelox (Moxifloxacin Hcl In Nacl) Swelling  . Chantix (Varenicline) Nausea Only  . Imipramine Hcl   . Tetracyclines & Related Swelling    Chief Complaint  Patient presents with  . 6 week follow up    following up on pain and blood sugar    HPI: Patient is a 52 y.o. female seen in the office today for 6 week follow up on  blood sugar Pt reports she has been more confused. Not eating right. Not remembering her medication. Reports she is unsteady and there has been many times she has almost fallen. Increased pain in her back Her daughter was previously helping her but is working a different shift.    Review of System: ROS   Past Medical History  Diagnosis Date  . Crohn's disease     hx of crohn's dz  . COPD (chronic obstructive pulmonary disease)   . Coronary artery disease   . Hypertension   . Diabetes mellitus   . Thyroid disease     hypothyroidism  . Arthritis   . Asthma   . AC (acromioclavicular) joint bone spurs     neck  . Depressed   . Nausea alone   . Unspecified gingival and periodontal disease   . Palpitations   . Allergic rhinitis due to pollen   . Methicillin resistant Staphylococcus aureus septicemia   . Acute bronchitis   . Type II or unspecified type diabetes mellitus without mention of complication, uncontrolled   . Carbuncle and furuncle of trunk   . Abdominal pain, unspecified site   . Sacroiliitis, not elsewhere classified   . Hyperventilation   . Flaccid hemiplegia affecting dominant side   . Pain in joint, pelvic region and thigh   . Cough   . Disturbance of skin sensation   . Herpes zoster with unspecified nervous system complication   . Chest pain, unspecified   . Nonspecific abnormal electrocardiogram (ECG) (EKG)   . Candidiasis of mouth   . Hyperlipidemia   . Tobacco use disorder   . Regional enteritis of large intestine    . Osteoarthrosis, unspecified whether generalized or localized, unspecified site   . Intrinsic asthma, unspecified   . Lumbago   . Blood in stool   . Unspecified nontoxic nodular goiter   . Anxiety   . Edema   . Abdominal pain, right lower quadrant   . Dysmenorrhea   . Unspecified urinary incontinence   . Regional enteritis of unspecified site   . Type II or unspecified type diabetes mellitus without mention of complication, not stated as uncontrolled   . GERD (gastroesophageal reflux disease)   . Other malaise and fatigue   . Encounter for long-term (current) use of other medications   . Unspecified constipation   . Irritable bowel syndrome   . Diaphragmatic hernia without mention of obstruction or gangrene    Past Surgical History  Procedure Laterality Date  . Metatarsal reconstruction  1993  . Tubalization  1982  . Right foot surgery in 1993 twice    . Right foot surgery in 1995     Social History:   reports that she has been smoking.  She does not have any smokeless tobacco history on file. She reports that she does not drink alcohol or use illicit drugs.  Family History  Problem Relation Age of Onset  . Stroke  Mother     Medications: Patient's Medications  New Prescriptions   No medications on file  Previous Medications   ALBUTEROL (PROVENTIL) (2.5 MG/3ML) 0.083% NEBULIZER SOLUTION    Take 2.5 mg by nebulization every 6 (six) hours as needed. For shortness of breath   ALPRAZOLAM (XANAX) 1 MG TABLET    Take one tablet three times a day for nerves and sleep at night time   BUMETANIDE (BUMEX) 2 MG TABLET    Take 2 mg by mouth daily. Take 1 tablet daily as needed to control edema.   CHLORHEXIDINE (PERIDEX) 0.12 % SOLUTION    Use as directed 15 mLs in the mouth or throat daily.   FLUCONAZOLE (DIFLUCAN) 100 MG TABLET    Take 100 mg by mouth daily.    GLIPIZIDE (GLUCOTROL XL) 5 MG 24 HR TABLET    Take 1 tablet (5 mg total) by mouth daily.   LACTULOSE (CHRONULAC) 10  GM/15ML SOLUTION    Take 20 g by mouth daily as needed. For constipation   LISINOPRIL (PRINIVIL,ZESTRIL) 10 MG TABLET    Take 10 mg by mouth daily. Take 1 tablet daily to control blood pressure.   LUBIPROSTONE (AMITIZA) 24 MCG CAPSULE    Take 24 mcg by mouth daily. Take 1 capsule daily to prevent constipation.   METFORMIN (GLUCOPHAGE) 1000 MG TABLET    Take one tablet twice a day for diabetes 250.00   OXYCODONE HCL 10 MG TABS    Take one tablet up to 6 times as needed to control pain   PREDNISONE (DELTASONE) 5 MG TABLET    Take 5 mg by mouth daily. Take 2 tablets daily for 1 week , then 1 daily to help inflammatory bowel disease.   PROMETHAZINE (PHENERGAN) 12.5 MG TABLET    Take 12.5 mg by mouth every 6 (six) hours as needed. Take 1 tablet every 6 hours as needed for nausea.   SIMVASTATIN (ZOCOR) 20 MG TABLET    Take 20 mg by mouth every evening. Take 1 tablet to lower cholesterol.   SPIRONOLACTONE (ALDACTONE) 25 MG TABLET    Take 25 mg by mouth daily.   VILAZODONE HCL (VIIBRYD) 40 MG TABS    Take by mouth daily. Take one tablet once a day for depression  Modified Medications   No medications on file  Discontinued Medications   No medications on file     Physical Exam:  Filed Vitals:   12/25/12 1424  BP: 130/82  Pulse: 98  Temp: 98.7 F (37.1 C)  TempSrc: Oral  Resp: 16  Height: 5\' 2"  (1.575 m)  Weight: 194 lb (87.998 kg)  SpO2: 99%    Physical Exam  Constitutional: She is oriented to person, place, and time and well-developed, well-nourished, and in no distress. No distress.  Cardiovascular: Normal rate and regular rhythm.   Pulmonary/Chest: Effort normal and breath sounds normal.  Musculoskeletal: She exhibits tenderness. She exhibits no edema.  Walks with walker  Neurological: She is alert and oriented to person, place, and time.  Skin: Skin is warm and dry. She is not diaphoretic.    Labs reviewed: Basic Metabolic Panel:  Recent Labs  16/10/96 1041 10/31/12 1144   NA 139 141  K 3.8 5.3*  CL 104 101  CO2 28 26  GLUCOSE 171* 201*  BUN 10 11  CREATININE 0.59 0.64  CALCIUM 9.6 10.1   Liver Function Tests:  Recent Labs  10/31/12 1144  AST 13  ALT 22  ALKPHOS 87  BILITOT 0.1  PROT 6.5   No results found for this basename: LIPASE, AMYLASE,  in the last 8760 hours No results found for this basename: AMMONIA,  in the last 8760 hours CBC:  Recent Labs  08/18/12 1041 10/31/12 1144  WBC 9.8 9.5  NEUTROABS 5.8 5.3  HGB 14.0 14.1  HCT 41.6 43.3  MCV 80.9 83  PLT 261  --    Lipid Panel: No results found for this basename: CHOL, HDL, LDLCALC, TRIG, CHOLHDL, LDLDIRECT,  in the last 8760 hours  Past Procedures:     Assessment/Plan 1.   Depression 311   Improved on viibryd; samples provided until pt can fill Rx.      2.   Weakness generalized 780.79     Form filled out to get assistance throughout the day to help with ADLs   3.   Osteoarthrosis, unspecified whether generalized or localized, involving lower leg 715.96    Worsening with increase activity. Encouraged exercise and therapies. Pt declined therapy services at this time    4.   Type II or unspecified type diabetes mellitus with neurological manifestations, uncontrolled(250.62) 250.62       Pt having frequent episodes of hypoglycemia- educated on having a snack on hand; proper nutrition throughout the day; will decrease glipizide to every other day and for her to hold if blood sugars are below 70  Will follow up in 3 months with lab work before- A1c and CMP

## 2012-12-25 NOTE — Patient Instructions (Addendum)
Follow up in 3 months with lab work before visit  Decrease Glipizide to every other day.  Eat 3 meals a day and a healthy bedtime snack   Type 2 Diabetes Mellitus, Adult Type 2 diabetes mellitus, often simply referred to as type 2 diabetes, is a long-lasting (chronic) disease. In type 2 diabetes, the pancreas does not make enough insulin (a hormone), the cells are less responsive to the insulin that is made (insulin resistance), or both. Normally, insulin moves sugars from food into the tissue cells. The tissue cells use the sugars for energy. The lack of insulin or the lack of normal response to insulin causes excess sugars to build up in the blood instead of going into the tissue cells. As a result, high blood sugar (hyperglycemia) develops. The effect of high sugar (glucose) levels can cause many complications. Type 2 diabetes was also previously called adult-onset diabetes but it can occur at any age.  RISK FACTORS  A person is predisposed to developing type 2 diabetes if someone in the family has the disease and also has one or more of the following primary risk factors:  Overweight.  An inactive lifestyle.  A history of consistently eating high-calorie foods. Maintaining a normal weight and regular physical activity can reduce the chance of developing type 2 diabetes. SYMPTOMS  A person with type 2 diabetes may not show symptoms initially. The symptoms of type 2 diabetes appear slowly. The symptoms include:  Increased thirst (polydipsia).  Increased urination (polyuria).  Increased urination during the night (nocturia).  Weight loss. This weight loss may be rapid.  Frequent, recurring infections.  Tiredness (fatigue).  Weakness.  Vision changes, such as blurred vision.  Fruity smell to your breath.  Abdominal pain.  Nausea or vomiting.  Cuts or bruises which are slow to heal.  Tingling or numbness in the hands or feet. DIAGNOSIS Type 2 diabetes is frequently  not diagnosed until complications of diabetes are present. Type 2 diabetes is diagnosed when symptoms or complications are present and when blood glucose levels are increased. Your blood glucose level may be checked by one or more of the following blood tests:  A fasting blood glucose test. You will not be allowed to eat for at least 8 hours before a blood sample is taken.  A random blood glucose test. Your blood glucose is checked at any time of the day regardless of when you ate.  A hemoglobin A1c blood glucose test. A hemoglobin A1c test provides information about blood glucose control over the previous 3 months.  An oral glucose tolerance test (OGTT). Your blood glucose is measured after you have not eaten (fasted) for 2 hours and then after you drink a glucose-containing beverage. TREATMENT   You may need to take insulin or diabetes medicine daily to keep blood glucose levels in the desired range.  You will need to match insulin dosing with exercise and healthy food choices. The treatment goal is to maintain the before meal blood sugar (preprandial glucose) level at 70 130 mg/dL. HOME CARE INSTRUCTIONS   Have your hemoglobin A1c level checked twice a year.  Perform daily blood glucose monitoring as directed by your caregiver.  Monitor urine ketones when you are ill and as directed by your caregiver.  Take your diabetes medicine or insulin as directed by your caregiver to maintain your blood glucose levels in the desired range.  Never run out of diabetes medicine or insulin. It is needed every day.  Adjust insulin based on  your intake of carbohydrates. Carbohydrates can raise blood glucose levels but need to be included in your diet. Carbohydrates provide vitamins, minerals, and fiber which are an essential part of a healthy diet. Carbohydrates are found in fruits, vegetables, whole grains, dairy products, legumes, and foods containing added sugars.    Eat healthy foods. Alternate  3 meals with 3 snacks.  Lose weight if overweight.  Carry a medical alert card or wear your medical alert jewelry.  Carry a 15 gram carbohydrate snack with you at all times to treat low blood glucose (hypoglycemia). Some examples of 15 gram carbohydrate snacks include:  Glucose tablets, 3 or 4   Glucose gel, 15 gram tube  Raisins, 2 tablespoons (24 grams)  Jelly beans, 6  Animal crackers, 8  Regular pop, 4 ounces (120 mL)  Gummy treats, 9  Recognize hypoglycemia. Hypoglycemia occurs with blood glucose levels of 70 mg/dL and below. The risk for hypoglycemia increases when fasting or skipping meals, during or after intense exercise, and during sleep. Hypoglycemia symptoms can include:  Tremors or shakes.  Decreased ability to concentrate.  Sweating.  Increased heart rate.  Headache.  Dry mouth.  Hunger.  Irritability.  Anxiety.  Restless sleep.  Altered speech or coordination.  Confusion.  Treat hypoglycemia promptly. If you are alert and able to safely swallow, follow the 15:15 rule:  Take 15 20 grams of rapid-acting glucose or carbohydrate. Rapid-acting options include glucose gel, glucose tablets, or 4 ounces (120 mL) of fruit juice, regular soda, or low fat milk.  Check your blood glucose level 15 minutes after taking the glucose.  Take 15 20 grams more of glucose if the repeat blood glucose level is still 70 mg/dL or below.  Eat a meal or snack within 1 hour once blood glucose levels return to normal.    Be alert to polyuria and polydipsia which are early signs of hyperglycemia. An early awareness of hyperglycemia allows for prompt treatment. Treat hyperglycemia as directed by your caregiver.  Engage in at least 150 minutes of moderate-intensity physical activity a week, spread over at least 3 days of the week or as directed by your caregiver. In addition, you should engage in resistance exercise at least 2 times a week or as directed by your  caregiver.  Adjust your medicine and food intake as needed if you start a new exercise or sport.  Follow your sick day plan at any time you are unable to eat or drink as usual.  Avoid tobacco use.  Limit alcohol intake to no more than 1 drink per day for nonpregnant women and 2 drinks per day for men. You should drink alcohol only when you are also eating food. Talk with your caregiver whether alcohol is safe for you. Tell your caregiver if you drink alcohol several times a week.  Follow up with your caregiver regularly.  Schedule an eye exam soon after the diagnosis of type 2 diabetes and then annually.  Perform daily skin and foot care. Examine your skin and feet daily for cuts, bruises, redness, nail problems, bleeding, blisters, or sores. A foot exam by a caregiver should be done annually.  Brush your teeth and gums at least twice a day and floss at least once a day. Follow up with your dentist regularly.  Share your diabetes management plan with your workplace or school.  Stay up-to-date with immunizations.  Learn to manage stress.  Obtain ongoing diabetes education and support as needed.  Participate in, or seek rehabilitation  as needed to maintain or improve independence and quality of life. Request a physical or occupational therapy referral if you are having foot or hand numbness or difficulties with grooming, dressing, eating, or physical activity. SEEK MEDICAL CARE IF:   You are unable to eat food or drink fluids for more than 6 hours.  You have nausea and vomiting for more than 6 hours.  Your blood glucose level is over 240 mg/dL.  There is a change in mental status.  You develop an additional serious illness.  You have diarrhea for more than 6 hours.  You have been sick or have had a fever for a couple of days and are not getting better.  You have pain during any physical activity.  SEEK IMMEDIATE MEDICAL CARE IF:  You have difficulty breathing.  You have  moderate to large ketone levels. MAKE SURE YOU:  Understand these instructions.  Will watch your condition.  Will get help right away if you are not doing well or get worse. Document Released: 07/03/2005 Document Revised: 03/27/2012 Document Reviewed: 01/30/2012 [facility] Patient Information 2014 [place], [place].

## 2013-01-02 ENCOUNTER — Encounter: Payer: Self-pay | Admitting: Nurse Practitioner

## 2013-01-21 ENCOUNTER — Other Ambulatory Visit: Payer: Self-pay | Admitting: Geriatric Medicine

## 2013-01-21 MED ORDER — OXYCODONE HCL 10 MG PO TABS: ORAL_TABLET | ORAL | Status: DC

## 2013-02-21 ENCOUNTER — Other Ambulatory Visit: Payer: Self-pay | Admitting: Geriatric Medicine

## 2013-02-21 MED ORDER — OXYCODONE HCL 10 MG PO TABS: ORAL_TABLET | ORAL | Status: DC

## 2013-02-21 MED ORDER — ALPRAZOLAM 1 MG PO TABS: ORAL_TABLET | ORAL | Status: DC

## 2013-02-25 DIAGNOSIS — M25559 Pain in unspecified hip: Secondary | ICD-10-CM | POA: Insufficient documentation

## 2013-03-24 ENCOUNTER — Other Ambulatory Visit: Payer: Self-pay | Admitting: *Deleted

## 2013-03-24 MED ORDER — OXYCODONE HCL 10 MG PO TABS: ORAL_TABLET | ORAL | Status: DC

## 2013-03-25 ENCOUNTER — Other Ambulatory Visit: Payer: Medicare Other

## 2013-03-25 ENCOUNTER — Other Ambulatory Visit: Payer: Self-pay | Admitting: *Deleted

## 2013-03-25 DIAGNOSIS — E785 Hyperlipidemia, unspecified: Secondary | ICD-10-CM

## 2013-03-25 DIAGNOSIS — E1149 Type 2 diabetes mellitus with other diabetic neurological complication: Secondary | ICD-10-CM

## 2013-03-26 LAB — BASIC METABOLIC PANEL
BUN/Creatinine Ratio: 23 (ref 9–23)
Creatinine, Ser: 0.61 mg/dL (ref 0.57–1.00)
GFR calc non Af Amer: 104 mL/min/{1.73_m2} (ref >59)
Potassium: 4.4 mmol/L (ref 3.5–5.2)
Sodium: 143 mmol/L (ref 134–144)

## 2013-03-27 ENCOUNTER — Ambulatory Visit (INDEPENDENT_AMBULATORY_CARE_PROVIDER_SITE_OTHER): Payer: Medicare Other | Admitting: Nurse Practitioner

## 2013-03-27 ENCOUNTER — Encounter: Payer: Self-pay | Admitting: Nurse Practitioner

## 2013-03-27 VITALS — BP 138/76 | HR 95 | Temp 98.8°F | Wt 198.0 lb

## 2013-03-27 DIAGNOSIS — G8929 Other chronic pain: Secondary | ICD-10-CM

## 2013-03-27 DIAGNOSIS — E785 Hyperlipidemia, unspecified: Secondary | ICD-10-CM

## 2013-03-27 DIAGNOSIS — M1710 Unilateral primary osteoarthritis, unspecified knee: Secondary | ICD-10-CM

## 2013-03-27 DIAGNOSIS — M5431 Sciatica, right side: Secondary | ICD-10-CM

## 2013-03-27 DIAGNOSIS — M549 Dorsalgia, unspecified: Secondary | ICD-10-CM

## 2013-03-27 DIAGNOSIS — F329 Major depressive disorder, single episode, unspecified: Secondary | ICD-10-CM

## 2013-03-27 DIAGNOSIS — I10 Essential (primary) hypertension: Secondary | ICD-10-CM

## 2013-03-27 DIAGNOSIS — M5430 Sciatica, unspecified side: Secondary | ICD-10-CM

## 2013-03-27 DIAGNOSIS — Z23 Encounter for immunization: Secondary | ICD-10-CM

## 2013-03-27 DIAGNOSIS — R531 Weakness: Secondary | ICD-10-CM

## 2013-03-27 DIAGNOSIS — E1149 Type 2 diabetes mellitus with other diabetic neurological complication: Secondary | ICD-10-CM

## 2013-03-27 DIAGNOSIS — R5381 Other malaise: Secondary | ICD-10-CM

## 2013-03-27 MED ORDER — PREDNISONE 20 MG PO TABS: 20.0000 mg | ORAL_TABLET | Freq: Every day | ORAL | Status: DC

## 2013-03-27 MED ORDER — LACTULOSE 10 GM/15ML PO SOLN: 20.0000 g | Freq: Every day | ORAL | Status: DC | PRN

## 2013-03-27 MED ORDER — VILAZODONE HCL 40 MG PO TABS: 40.0000 mg | ORAL_TABLET | Freq: Every day | ORAL | Status: DC

## 2013-03-27 MED ORDER — FLUCONAZOLE 100 MG PO TABS: 100.0000 mg | ORAL_TABLET | Freq: Every day | ORAL | Status: DC

## 2013-03-27 NOTE — Progress Notes (Signed)
Patient ID: [name] [name], female   DOB: [birth year], 52 y.o.   MRN: [id number]   Allergies  Allergen Reactions  . Avelox [Moxifloxacin Hcl In Nacl] Swelling  . Chantix [Varenicline] Nausea Only  . Imipramine Hcl   . Tetracyclines & Related Swelling    Chief Complaint  Patient presents with  . Medical Managment of Chronic Issues    3 month follow-up, discuss labs (copy printed)   . Hip Pain    constant hip pain, fell x 2 (pain increased since fall)   . Chest Pain    ongoing concern, feels like something heavy on chest, ? related to a cold   . Immunizations    ? flu vaccine patient c/o fever off/on     HPI: Patient is a 52 y.o. female seen in the office today for routine follow up and to discuss lab work Pt reports she has 1 fall where she hit the floor then the 2nd time she slide herself down Walking sideways and having increase pain in her right side. Right side of back/ buttocks with pain shooting up and down her right leg.  conts to smoke; having  increase sputum and phelm, increase in shortenss of breath- normally happens around this time of year; does not wish to have any medications because they make her sick; does not use rescue inhaler with shortness of breath   Has been complaint with her diabetic medication Still does not follow appropriate diet and eats randomly No longer having lows- did not bring log    Review of Systems:  Review of Systems  Constitutional: Negative for fever, chills and malaise/fatigue.  Respiratory: Positive for sputum production and shortness of breath. Negative for cough and wheezing.   Cardiovascular: Negative for chest pain and palpitations.  Gastrointestinal: Positive for abdominal pain and constipation (lactuose helps). Negative for nausea, vomiting and diarrhea. Heartburn: occasionally take OTC medication which helps.  Genitourinary: Negative for dysuria, urgency and frequency.  Musculoskeletal: Positive for myalgias, back pain and joint  pain.  Skin: Negative.   Neurological: Positive for tingling. Negative for weakness and headaches. Tremors: down right leg.  Psychiatric/Behavioral: Positive for depression. The patient is nervous/anxious and has insomnia.        Taking viibryd which has help with mood     Past Medical History  Diagnosis Date  . Crohn's disease     hx of crohn's dz  . COPD (chronic obstructive pulmonary disease)   . Coronary artery disease   . Hypertension   . Diabetes mellitus   . Thyroid disease     hypothyroidism  . Arthritis   . Asthma   . AC (acromioclavicular) joint bone spurs     neck  . Depressed   . Nausea alone   . Unspecified gingival and periodontal disease   . Palpitations   . Allergic rhinitis due to pollen   . Methicillin resistant Staphylococcus aureus septicemia   . Acute bronchitis   . Type II or unspecified type diabetes mellitus without mention of complication, uncontrolled   . Carbuncle and furuncle of trunk   . Abdominal pain, unspecified site   . Sacroiliitis, not elsewhere classified   . Hyperventilation   . Flaccid hemiplegia affecting dominant side   . Pain in joint, pelvic region and thigh   . Cough   . Disturbance of skin sensation   . Herpes zoster with unspecified nervous system complication   . Chest pain, unspecified   . Nonspecific abnormal electrocardiogram (ECG) (EKG)   .  Candidiasis of mouth   . Hyperlipidemia   . Tobacco use disorder   . Regional enteritis of large intestine   . Osteoarthrosis, unspecified whether generalized or localized, unspecified site   . Intrinsic asthma, unspecified   . Lumbago   . Blood in stool   . Unspecified nontoxic nodular goiter   . Anxiety   . Edema   . Abdominal pain, right lower quadrant   . Dysmenorrhea   . Unspecified urinary incontinence   . Regional enteritis of unspecified site   . Type II or unspecified type diabetes mellitus without mention of complication, not stated as uncontrolled   . GERD  (gastroesophageal reflux disease)   . Other malaise and fatigue   . Encounter for long-term (current) use of other medications   . Unspecified constipation   . Irritable bowel syndrome   . Diaphragmatic hernia without mention of obstruction or gangrene    Past Surgical History  Procedure Laterality Date  . Metatarsal reconstruction  1993  . Tubalization  1982  . Right foot surgery in 1993 twice    . Right foot surgery in 1995     Social History:   reports that she has been smoking.  She does not have any smokeless tobacco history on file. She reports that she does not drink alcohol or use illicit drugs.  Family History  Problem Relation Age of Onset  . Stroke Mother     Medications: Patient's Medications  New Prescriptions   No medications on file  Previous Medications   ALBUTEROL (PROVENTIL) (2.5 MG/3ML) 0.083% NEBULIZER SOLUTION    Take 2.5 mg by nebulization every 6 (six) hours as needed. For shortness of breath   ALPRAZOLAM (XANAX) 1 MG TABLET    Take one tablet three times a day for nerves and sleep at night time   BUMETANIDE (BUMEX) 2 MG TABLET    Take 2 mg by mouth daily. Take 1 tablet daily as needed to control edema.   CHLORHEXIDINE (PERIDEX) 0.12 % SOLUTION    Use as directed 15 mLs in the mouth or throat daily.   FLUCONAZOLE (DIFLUCAN) 100 MG TABLET    Take 100 mg by mouth daily.    GLIPIZIDE (GLUCOTROL XL) 5 MG 24 HR TABLET    Take 1 tablet (5 mg total) by mouth daily.   LACTULOSE (CHRONULAC) 10 GM/15ML SOLUTION    Take 20 g by mouth daily as needed. For constipation   LISINOPRIL (PRINIVIL,ZESTRIL) 10 MG TABLET    Take 10 mg by mouth daily. Take 1 tablet daily to control blood pressure.   LUBIPROSTONE (AMITIZA) 24 MCG CAPSULE    Take 24 mcg by mouth daily. Take 1 capsule daily to prevent constipation.   METFORMIN (GLUCOPHAGE) 1000 MG TABLET    Take one tablet twice a day for diabetes 250.00   OXYCODONE HCL 10 MG TABS    Take one tablet up to 6 times as needed to  control pain   PREDNISONE (DELTASONE) 5 MG TABLET    Take 5 mg by mouth daily. Take 2 tablets daily for 1 week , then 1 daily to help inflammatory bowel disease.   PROMETHAZINE (PHENERGAN) 12.5 MG TABLET    Take 12.5 mg by mouth every 6 (six) hours as needed. Take 1 tablet every 6 hours as needed for nausea.   SIMVASTATIN (ZOCOR) 20 MG TABLET    Take 20 mg by mouth every evening. Take 1 tablet to lower cholesterol.   SPIRONOLACTONE (ALDACTONE) 25 MG TABLET  Take 25 mg by mouth daily.   VILAZODONE HCL (VIIBRYD) 40 MG TABS    Take 1 tablet (40 mg total) by mouth daily. Take one tablet once a day for depression  Modified Medications   No medications on file  Discontinued Medications   No medications on file     Physical Exam:  Filed Vitals:   03/27/13 1010  BP: 138/76  Pulse: 95  Temp: 98.8 F (37.1 C)  TempSrc: Oral  Weight: 198 lb (89.812 kg)  SpO2: 98%   Physical Exam  Vitals reviewed. Constitutional: She is well-developed, well-nourished, and in no distress. No distress.  HENT:  Head: Normocephalic and atraumatic.  Mouth/Throat: Oropharynx is clear and moist. No oropharyngeal exudate.  Eyes: Conjunctivae are normal. Pupils are equal, round, and reactive to light.  Neck: Normal range of motion. Neck supple. No thyromegaly present.  Cardiovascular: Normal rate, regular rhythm and normal heart sounds.   Pulmonary/Chest: Effort normal and breath sounds normal. No respiratory distress.  Abdominal: Soft. Bowel sounds are normal. She exhibits no distension. There is no tenderness.  Musculoskeletal: Normal range of motion. She exhibits tenderness (right of lumbar spine). She exhibits no edema.  Tenderness on right buttocks; positive leg raises  Neurological: She is alert.  Skin: Skin is warm and dry. She is not diaphoretic.     Labs reviewed: Basic Metabolic Panel:  Recent Labs  16/10/96 1041 10/31/12 1144 03/25/13 0913  NA 139 141 143  K 3.8 5.3* 4.4  CL 104 101 100   CO2 28 26 23   GLUCOSE 171* 201* 133*  BUN 10 11 14   CREATININE 0.59 0.64 0.61  CALCIUM 9.6 10.1 9.4   Liver Function Tests:  Recent Labs  10/31/12 1144  AST 13  ALT 22  ALKPHOS 87  BILITOT 0.1  PROT 6.5   No results found for this basename: LIPASE, AMYLASE,  in the last 8760 hours No results found for this basename: AMMONIA,  in the last 8760 hours CBC:  Recent Labs  08/18/12 1041 10/31/12 1144  WBC 9.8 9.5  NEUTROABS 5.8 5.3  HGB 14.0 14.1  HCT 41.6 43.3  MCV 80.9 83  PLT 261  --      Assessment/Plan 1. Depression Stable on viibryd; no worsening depression or thoughts of sucided  - Vilazodone HCl (VIIBRYD) 40 MG TABS; Take 1 tablet (40 mg total) by mouth daily. Take one tablet once a day for depression  Dispense: 90 tablet; Refill: 1  2. Sciatica, right New after fall; will give prednisone course and refer to home health due to pain and falls - predniSONE (DELTASONE) 20 MG tablet; Take 1 tablet (20 mg total) by mouth daily. Take 40 mg (2 tablets) daily for 5 days  Dispense: 10 tablet; Refill: 0 - Ambulatory referral to Home Health  3. Chronic back pain Worse after falls; will refer to [facility] therapy to eval and treat and evaluate for home safety  - Ambulatory referral to Home Health  4. Hypertension Stable on current medications - CBC With differential/Platelet; Future - Comprehensive metabolic panel; Future  5. 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   7. Osteoarthrosis, unspecified whether generalized or localized, involving lower leg Ongoing and worse due to falls and gait instability   8. Weakness generalized Uses rolling walker still having falls; HH to  evaluate and treate  9. Other and unspecified hyperlipidemia Will follow up lipids  before next visit; encouraged diet modifications and exercise as tolerated  - Lipid panel; Future

## 2013-03-27 NOTE — Patient Instructions (Signed)
For back and buttocks pain-  Take prednisone 40 mg for 5 days Use heat to affected area 2-3 times daily  Call if respiratory symptoms fail to improve or get worse   Will get home health referral   Keep up the good work with your diabetic medication- check blood sugars and bring to next visit

## 2013-04-01 DIAGNOSIS — M5430 Sciatica, unspecified side: Secondary | ICD-10-CM

## 2013-04-01 DIAGNOSIS — R269 Unspecified abnormalities of gait and mobility: Secondary | ICD-10-CM

## 2013-04-01 DIAGNOSIS — I2510 Atherosclerotic heart disease of native coronary artery without angina pectoris: Secondary | ICD-10-CM

## 2013-04-01 DIAGNOSIS — F329 Major depressive disorder, single episode, unspecified: Secondary | ICD-10-CM

## 2013-04-22 ENCOUNTER — Other Ambulatory Visit: Payer: Self-pay | Admitting: *Deleted

## 2013-04-22 MED ORDER — OXYCODONE HCL 10 MG PO TABS: ORAL_TABLET | ORAL | Status: DC

## 2013-05-12 ENCOUNTER — Other Ambulatory Visit: Payer: Self-pay | Admitting: Nurse Practitioner

## 2013-05-22 ENCOUNTER — Other Ambulatory Visit: Payer: Self-pay | Admitting: *Deleted

## 2013-05-22 ENCOUNTER — Emergency Department (HOSPITAL_COMMUNITY): Payer: Medicare Other

## 2013-05-22 ENCOUNTER — Encounter (HOSPITAL_COMMUNITY): Payer: Self-pay | Admitting: Emergency Medicine

## 2013-05-22 ENCOUNTER — Emergency Department (HOSPITAL_COMMUNITY)
Admission: EM | Admit: 2013-05-22 | Discharge: 2013-05-23 | Disposition: A | Discharge status: Home / Self Care | Payer: Medicare Other | Attending: Emergency Medicine | Admitting: Emergency Medicine

## 2013-05-22 DIAGNOSIS — I2510 Atherosclerotic heart disease of native coronary artery without angina pectoris: Secondary | ICD-10-CM | POA: Insufficient documentation

## 2013-05-22 DIAGNOSIS — Z79899 Other long term (current) drug therapy: Secondary | ICD-10-CM | POA: Insufficient documentation

## 2013-05-22 DIAGNOSIS — E785 Hyperlipidemia, unspecified: Secondary | ICD-10-CM | POA: Insufficient documentation

## 2013-05-22 DIAGNOSIS — R300 Dysuria: Secondary | ICD-10-CM | POA: Insufficient documentation

## 2013-05-22 DIAGNOSIS — J441 Chronic obstructive pulmonary disease with (acute) exacerbation: Secondary | ICD-10-CM | POA: Insufficient documentation

## 2013-05-22 DIAGNOSIS — F411 Generalized anxiety disorder: Secondary | ICD-10-CM | POA: Insufficient documentation

## 2013-05-22 DIAGNOSIS — IMO0002 Reserved for concepts with insufficient information to code with codable children: Secondary | ICD-10-CM | POA: Insufficient documentation

## 2013-05-22 DIAGNOSIS — I10 Essential (primary) hypertension: Secondary | ICD-10-CM | POA: Insufficient documentation

## 2013-05-22 DIAGNOSIS — J40 Bronchitis, not specified as acute or chronic: Secondary | ICD-10-CM

## 2013-05-22 DIAGNOSIS — Z872 Personal history of diseases of the skin and subcutaneous tissue: Secondary | ICD-10-CM | POA: Insufficient documentation

## 2013-05-22 DIAGNOSIS — F17200 Nicotine dependence, unspecified, uncomplicated: Secondary | ICD-10-CM | POA: Insufficient documentation

## 2013-05-22 DIAGNOSIS — F329 Major depressive disorder, single episode, unspecified: Secondary | ICD-10-CM | POA: Insufficient documentation

## 2013-05-22 DIAGNOSIS — Z8619 Personal history of other infectious and parasitic diseases: Secondary | ICD-10-CM | POA: Insufficient documentation

## 2013-05-22 DIAGNOSIS — M1990 Unspecified osteoarthritis, unspecified site: Secondary | ICD-10-CM | POA: Insufficient documentation

## 2013-05-22 DIAGNOSIS — Z8669 Personal history of other diseases of the nervous system and sense organs: Secondary | ICD-10-CM | POA: Insufficient documentation

## 2013-05-22 DIAGNOSIS — R609 Edema, unspecified: Secondary | ICD-10-CM | POA: Insufficient documentation

## 2013-05-22 DIAGNOSIS — G8921 Chronic pain due to trauma: Secondary | ICD-10-CM | POA: Insufficient documentation

## 2013-05-22 DIAGNOSIS — I509 Heart failure, unspecified: Secondary | ICD-10-CM | POA: Insufficient documentation

## 2013-05-22 DIAGNOSIS — Z8719 Personal history of other diseases of the digestive system: Secondary | ICD-10-CM | POA: Insufficient documentation

## 2013-05-22 DIAGNOSIS — Z8742 Personal history of other diseases of the female genital tract: Secondary | ICD-10-CM | POA: Insufficient documentation

## 2013-05-22 DIAGNOSIS — M25559 Pain in unspecified hip: Secondary | ICD-10-CM | POA: Insufficient documentation

## 2013-05-22 DIAGNOSIS — F3289 Other specified depressive episodes: Secondary | ICD-10-CM | POA: Insufficient documentation

## 2013-05-22 DIAGNOSIS — IMO0001 Reserved for inherently not codable concepts without codable children: Secondary | ICD-10-CM | POA: Insufficient documentation

## 2013-05-22 DIAGNOSIS — Z792 Long term (current) use of antibiotics: Secondary | ICD-10-CM | POA: Insufficient documentation

## 2013-05-22 DIAGNOSIS — R809 Proteinuria, unspecified: Secondary | ICD-10-CM | POA: Insufficient documentation

## 2013-05-22 DIAGNOSIS — Z8614 Personal history of Methicillin resistant Staphylococcus aureus infection: Secondary | ICD-10-CM | POA: Insufficient documentation

## 2013-05-22 LAB — URINE MICROSCOPIC-ADD ON

## 2013-05-22 LAB — URINALYSIS, ROUTINE W REFLEX MICROSCOPIC
Glucose, UA: NEGATIVE mg/dL
Hgb urine dipstick: NEGATIVE
Ketones, ur: NEGATIVE mg/dL
Leukocytes, UA: NEGATIVE
Nitrite: NEGATIVE
Protein, ur: >300 mg/dL — AB
Specific Gravity, Urine: 1.023 (ref 1.005–1.030)

## 2013-05-22 LAB — BASIC METABOLIC PANEL
BUN: 13 mg/dL (ref 6–23)
CO2: 26 mEq/L (ref 19–32)
Chloride: 101 mEq/L (ref 96–112)
Creatinine, Ser: 0.59 mg/dL (ref 0.50–1.10)
GFR calc Af Amer: >90 mL/min (ref >90)
Potassium: 3.6 mEq/L (ref 3.5–5.1)

## 2013-05-22 LAB — CBC
HCT: 44.2 % (ref 36.0–46.0)
MCH: 28.2 pg (ref 26.0–34.0)
MCV: 80.5 fL (ref 78.0–100.0)
RBC: 5.49 MIL/uL — ABNORMAL HIGH (ref 3.87–5.11)
RDW: 14.6 % (ref 11.5–15.5)
WBC: 11.5 10*3/uL — ABNORMAL HIGH (ref 4.0–10.5)

## 2013-05-22 LAB — D-DIMER, QUANTITATIVE: D-Dimer, Quant: 1.18 ug/mL-FEU — ABNORMAL HIGH (ref 0.00–0.48)

## 2013-05-22 LAB — POCT I-STAT TROPONIN I

## 2013-05-22 LAB — PRO B NATRIURETIC PEPTIDE: Pro B Natriuretic peptide (BNP): 22.2 pg/mL (ref 0–125)

## 2013-05-22 MED ORDER — OXYCODONE HCL 10 MG PO TABS: ORAL_TABLET | ORAL | Status: DC

## 2013-05-22 MED ORDER — IOHEXOL 350 MG/ML SOLN
100.0000 mL | Freq: Once | INTRAVENOUS | Status: AC | PRN
  Administered 2013-05-22: 100 mL via INTRAVENOUS

## 2013-05-22 NOTE — ED Notes (Signed)
Pt states that she has not had CP. Pt states SOB and she has noticed that she has increased in weight, in her legs. Pt states that for the past 3 weeks she has noticed this and has not wanted to bee seen.

## 2013-05-22 NOTE — ED Notes (Signed)
Pt still in radiology.

## 2013-05-22 NOTE — ED Notes (Signed)
Pt ambulated to bathroom for urine sample.

## 2013-05-22 NOTE — ED Notes (Signed)
Pt c/o bilateral leg swelling and SOB; pt sts trouble laying flat at night; pt sts hx of CHF and has had some weight gain

## 2013-05-22 NOTE — ED Notes (Signed)
Pt informed to not take metformin the next 48 hours.

## 2013-05-22 NOTE — ED Provider Notes (Signed)
CSN: [id number]     Arrival date & time 05/22/13  1756 History   First MD Initiated Contact with Patient 05/22/13 1841     Chief Complaint  Patient presents with  . Shortness of Breath  . Leg Swelling   (Consider location/radiation/quality/duration/timing/severity/associated sxs/prior Treatment) HPI  This is a 23 are old female with a history of coronary artery disease, COPD, hypertension, diabetes who presents with bilateral lower extremity swelling and soreness of breath.  Patient reports 3 weeks of symptoms. She denies cough or fever. She describes orthopnea and states that she is unable to lay flat at night and is not sleeping.  She reports a history of CHF and is on Bumex and spironolactone; however, the only echo in our system showed an EF of 55-65% in 2003.  Patient denies any chest pain currently but states that she sometimes has chest pain that last for minutes it comes and goes. Last episode was over a day ago. Patient is also complaining of two-month history of right hip pain. She had a fall in August.  Past Medical History  Diagnosis Date  . Crohn's disease     hx of crohn's dz  . COPD (chronic obstructive pulmonary disease)   . Coronary artery disease   . Hypertension   . Diabetes mellitus   . Thyroid disease     hypothyroidism  . Arthritis   . Asthma   . AC (acromioclavicular) joint bone spurs     neck  . Depressed   . Nausea alone   . Unspecified gingival and periodontal disease   . Palpitations   . Allergic rhinitis due to pollen   . Methicillin resistant Staphylococcus aureus septicemia   . Acute bronchitis   . Type II or unspecified type diabetes mellitus without mention of complication, uncontrolled   . Carbuncle and furuncle of trunk   . Abdominal pain, unspecified site   . Sacroiliitis, not elsewhere classified   . Hyperventilation   . Flaccid hemiplegia affecting dominant side   . Pain in joint, pelvic region and thigh   . Cough   . Disturbance of skin  sensation   . Herpes zoster with unspecified nervous system complication   . Chest pain, unspecified   . Nonspecific abnormal electrocardiogram (ECG) (EKG)   . Candidiasis of mouth   . Hyperlipidemia   . Tobacco use disorder   . Regional enteritis of large intestine   . Osteoarthrosis, unspecified whether generalized or localized, unspecified site   . Intrinsic asthma, unspecified   . Lumbago   . Blood in stool   . Unspecified nontoxic nodular goiter   . Anxiety   . Edema   . Abdominal pain, right lower quadrant   . Dysmenorrhea   . Unspecified urinary incontinence   . Regional enteritis of unspecified site   . Type II or unspecified type diabetes mellitus without mention of complication, not stated as uncontrolled   . GERD (gastroesophageal reflux disease)   . Other malaise and fatigue   . Encounter for long-term (current) use of other medications   . Unspecified constipation   . Irritable bowel syndrome   . Diaphragmatic hernia without mention of obstruction or gangrene    Past Surgical History  Procedure Laterality Date  . Metatarsal reconstruction  1993  . Tubalization  1982  . Right foot surgery in 1993 twice    . Right foot surgery in 1995     Family History  Problem Relation Age of Onset  . Stroke  Mother    History  Substance Use Topics  . Smoking status: Current Every Day Smoker -- 1.00 packs/day  . Smokeless tobacco: Not on file  . Alcohol Use: No   OB History   Grav Para Term Preterm Abortions TAB SAB Ect Mult Living                 Review of Systems  Constitutional: Negative for fever.  Respiratory: Positive for shortness of breath. Negative for cough and chest tightness.   Cardiovascular: Negative for chest pain.  Gastrointestinal: Negative for nausea, vomiting and abdominal pain.  Genitourinary: Positive for dysuria.  Musculoskeletal: Negative for back pain.  Skin: Negative for wound.  Neurological: Negative for headaches.   Psychiatric/Behavioral: Negative for confusion.  All other systems reviewed and are negative.    Allergies  Avelox; Chantix; Imipramine hcl; and Tetracyclines & related  Home Medications   Current Outpatient Rx  Name  Route  Sig  Dispense  Refill  . albuterol (PROVENTIL) (2.5 MG/3ML) 0.083% nebulizer solution   Nebulization   Take 2.5 mg by nebulization every 6 (six) hours as needed. For shortness of breath         . ALPRAZolam (XANAX) 1 MG tablet      Take one tablet three times a day for nerves and sleep at night time   90 tablet   3   . Aspirin-Salicylamide-Caffeine (BC HEADACHE POWDER PO)   Oral   Take 1 Package by mouth every 6 (six) hours as needed (pain).         . Biotin 1000 MCG tablet   Oral   Take 1,000 mcg by mouth daily.         . bumetanide (BUMEX) 2 MG tablet   Oral   Take 2 mg by mouth daily as needed (edema control).         . chlorhexidine (PERIDEX) 0.12 % solution   Mouth/Throat   Use as directed 15 mLs in the mouth or throat daily.         Marland Kitchen glipiZIDE (GLUCOTROL XL) 5 MG 24 hr tablet   Oral   Take 1 tablet (5 mg total) by mouth daily.   30 tablet   0   . lactulose (CHRONULAC) 10 GM/15ML solution   Oral   Take 30 mLs (20 g total) by mouth daily as needed. For constipation   240 mL   1   . lisinopril (PRINIVIL,ZESTRIL) 10 MG tablet   Oral   Take 10 mg by mouth daily. Take 1 tablet daily to control blood pressure.         . metFORMIN (GLUCOPHAGE) 1000 MG tablet      Take one tablet twice a day for diabetes 250.00   60 tablet   5   . Oxycodone HCl 10 MG TABS      Take one tablet up to 6 times as needed to control pain   180 tablet   0   . predniSONE (DELTASONE) 5 MG tablet   Oral   Take 5 mg by mouth daily. Take 2 tablets daily for 1 week , then 1 daily to help inflammatory bowel disease.         . promethazine (PHENERGAN) 12.5 MG tablet   Oral   Take 12.5 mg by mouth every 6 (six) hours as needed. Take 1 tablet  every 6 hours as needed for nausea.         . simvastatin (ZOCOR) 20 MG tablet  Oral   Take 20 mg by mouth every evening. Take 1 tablet to lower cholesterol.         Marland Kitchen spironolactone (ALDACTONE) 25 MG tablet   Oral   Take 25 mg by mouth daily.         . Vilazodone HCl (VIIBRYD) 40 MG TABS   Oral   Take 1 tablet (40 mg total) by mouth daily. Take one tablet once a day for depression   90 tablet   1   . amoxicillin (AMOXIL) 500 MG capsule   Oral   Take 2 capsules (1,000 mg total) by mouth 3 (three) times daily.   21 capsule   0   . azithromycin (ZITHROMAX) 250 MG tablet   Oral   Take 1 tablet (250 mg total) by mouth daily. Take first 2 tablets together, then 1 every day until finished.   6 tablet   0   . fluconazole (DIFLUCAN) 100 MG tablet   Oral   Take 1 tablet (100 mg total) by mouth daily.   1 tablet   1     Hold until requested by patient   . predniSONE (DELTASONE) 20 MG tablet   Oral   Take 1 tablet (20 mg total) by mouth daily. Take 40 mg (2 tablets) daily for 5 days   10 tablet   0    BP 116/67  Pulse 99  Temp(Src) 99.1 F (37.3 C) (Oral)  Resp 20  Ht 5\' 1"  (1.549 m)  Wt 206 lb (93.441 kg)  BMI 38.94 kg/m2  SpO2 96% Physical Exam  Nursing note and vitals reviewed. Constitutional: She is oriented to person, place, and time. She appears well-developed. No distress.  HENT:  Head: Normocephalic and atraumatic.  Mouth/Throat: Oropharynx is clear and moist.  Eyes: Pupils are equal, round, and reactive to light.  Neck: Neck supple.  Cardiovascular: Normal rate, regular rhythm and normal heart sounds.   No murmur heard. Pulmonary/Chest: Effort normal and breath sounds normal. No respiratory distress. She has no wheezes.  No crackles noted  Abdominal: Soft. Bowel sounds are normal. There is no tenderness. There is no rebound and no guarding.  Mild distention  Musculoskeletal:  3+ pitting bilateral lower extremity edema  Neurological: She is  alert and oriented to person, place, and time.  Skin: Skin is warm and dry. No rash noted.  Psychiatric: She has a normal mood and affect.    ED Course  Procedures (including critical care time) Labs Review Labs Reviewed  CBC - Abnormal; Notable for the following:    WBC 11.5 (*)    RBC 5.49 (*)    Hemoglobin 15.5 (*)    All other components within normal limits  BASIC METABOLIC PANEL - Abnormal; Notable for the following:    Glucose, Bld 176 (*)    All other components within normal limits  URINALYSIS, ROUTINE W REFLEX MICROSCOPIC - Abnormal; Notable for the following:    APPearance HAZY (*)    Protein, ur >300 (*)    All other components within normal limits  URINE MICROSCOPIC-ADD ON - Abnormal; Notable for the following:    Bacteria, UA FEW (*)    Casts HYALINE CASTS (*)    All other components within normal limits  D-DIMER, QUANTITATIVE - Abnormal; Notable for the following:    D-Dimer, Quant 1.18 (*)    All other components within normal limits  PRO B NATRIURETIC PEPTIDE  POCT I-STAT TROPONIN I   Imaging Review Dg Chest 2  View  05/22/2013   CLINICAL DATA:  Fluid build-up. Hypertension. Asthma. Emphysema. Chronic bronchitis. Leg swelling.  EXAM: CHEST  2 VIEW  COMPARISON:  02/17/2011.  FINDINGS: The cardiopericardial silhouette is within normal limits. There is no airspace disease. No pulmonary edema including interstitial edema. Pulmonary vascularity appears similar to the prior exam. No evidence of failure. No effusion.  IMPRESSION: No active cardiopulmonary disease.   Electronically Signed   By: Andreas Newport M.D.   On: 05/22/2013 18:47   Dg Hip Complete Right  05/22/2013   CLINICAL DATA:  52-year- female status post fall with right flank and leg pain. Lower extremity swelling. Initial encounter.  EXAM: RIGHT HIP - COMPLETE 2+ VIEW  COMPARISON:  08/24/2008.  FINDINGS: Femoral heads normally located. Pelvis appears intact. Bone mineralization is within normal limits.  Visualized bowel gas pattern is non obstructed. Proximal right femur appear stable and intact.  IMPRESSION: No acute osseous abnormality identified at the right hip or pelvis.   Electronically Signed   By: Augusto Gamble M.D.   On: 05/22/2013 21:49   Ct Angio Chest W/cm &/or Wo Cm  05/23/2013   CLINICAL DATA:  Shortness of breath. Leg swelling.  EXAM: CT ANGIOGRAPHY CHEST WITH CONTRAST  TECHNIQUE: Multidetector CT imaging of the chest was performed using the standard protocol during bolus administration of intravenous contrast. Multiplanar CT image reconstructions including MIPs were obtained to evaluate the vascular anatomy.  CONTRAST:  OMNIPAQUE IOHEXOL 350 MG/ML SOLN  COMPARISON:  Chest CT 11/18/2008.  FINDINGS: Mediastinum: No filling defects within the pulmonary arterial tree to suggest underlying pulmonary embolism. Heart size is normal. There is no significant pericardial fluid, thickening or pericardial calcification. No pathologically enlarged mediastinal or hilar lymph nodes. Multiple prominent but nonenlarged bilateral hilar and lower right peritracheal lymph nodes are noted, which are nonspecific. Esophagus is unremarkable in appearance.  Lungs/Pleura: Mild diffuse bronchial wall thickening. Patchy areas of mild peribronchovascular ground-glass attenuation are noted throughout the lungs bilaterally. No frank consolidative airspace disease. No pleural effusions. No suspicious appearing pulmonary nodules or masses.  Upper Abdomen: Unremarkable.  Musculoskeletal: There are no aggressive appearing lytic or blastic lesions noted in the visualized portions of the skeleton.  Review of the MIP images confirms the above findings.  IMPRESSION: 1. No evidence of pulmonary embolism. 2. Mild diffuse bronchial wall thickening. In addition there is some patchy diffuse peribronchovascular ground-glass attenuation. Findings are suspicious for an acute bronchitis, likely with developing multilobar bronchopneumonia.    Electronically Signed   By: Trudie Reed M.D.   On: 05/23/2013 00:00    EKG Interpretation     Ventricular Rate:  100 PR Interval:  156 QRS Duration: 84 QT Interval:  358 QTC Calculation: 461 R Axis:   -84 Text Interpretation:  Normal sinus rhythm Left axis deviation Low voltage QRS Abnormal ECG No significant change since last tracing            MDM   1. Bronchitis   2. Proteinuria   3. Edema     This a 60 are old female with history of CHF who presents with shortness of breath and bilateral lower extremity swelling. Patient has chronic shortness of breath and swelling but states it got worse the last 3 weeks. She's had a nonproductive cough but no fevers. She does have significant bilateral lower extremity edema but her lung exam is clear. Labwork was initially unremarkable with a normal BNP and clear chest x-ray. Patient was mildly tachycardic but it appears based  on chart review that her baseline heart rate is around 90-100. D-dimer was positive. CT scan of the chest was obtained and concerning for acute bronchitis with developing pneumonia. Patient is afebrile and is satting well. We'll give her an albuterol inhaler. She will be discharged home with amoxicillin and azithromycin and she has a fluoroquinolone allergy. Patient was also noted to have greater than 300 protein in her urine. This is likely contributing to her edema. Patient is to followup with her primary care physician and repeat lab work.  After history, exam, and medical workup I feel the patient has been appropriately medically screened and is safe for discharge home. Pertinent diagnoses were discussed with the patient. Patient was given return precautions.    Shon Baton, MD 05/24/13 [phone]

## 2013-05-22 NOTE — ED Notes (Signed)
Pt. Will be transferred to Pod A 6 from x-ray

## 2013-05-23 MED ORDER — AZITHROMYCIN 250 MG PO TABS: 250.0000 mg | ORAL_TABLET | Freq: Every day | ORAL | Status: DC

## 2013-05-23 MED ORDER — AMOXICILLIN 500 MG PO CAPS: 1000.0000 mg | ORAL_CAPSULE | Freq: Three times a day (TID) | ORAL | Status: DC

## 2013-05-28 ENCOUNTER — Encounter: Payer: Self-pay | Admitting: Internal Medicine

## 2013-05-28 ENCOUNTER — Ambulatory Visit (INDEPENDENT_AMBULATORY_CARE_PROVIDER_SITE_OTHER): Payer: Medicare Other | Admitting: Internal Medicine

## 2013-05-28 VITALS — BP 130/84 | HR 102 | Temp 98.0°F | Resp 16 | Wt 212.8 lb

## 2013-05-28 DIAGNOSIS — E1140 Type 2 diabetes mellitus with diabetic neuropathy, unspecified: Secondary | ICD-10-CM

## 2013-05-28 DIAGNOSIS — M25559 Pain in unspecified hip: Secondary | ICD-10-CM

## 2013-05-28 DIAGNOSIS — K056 Periodontal disease, unspecified: Secondary | ICD-10-CM

## 2013-05-28 DIAGNOSIS — E1142 Type 2 diabetes mellitus with diabetic polyneuropathy: Secondary | ICD-10-CM

## 2013-05-28 DIAGNOSIS — K5090 Crohn's disease, unspecified, without complications: Secondary | ICD-10-CM

## 2013-05-28 DIAGNOSIS — F329 Major depressive disorder, single episode, unspecified: Secondary | ICD-10-CM

## 2013-05-28 DIAGNOSIS — R5381 Other malaise: Secondary | ICD-10-CM

## 2013-05-28 DIAGNOSIS — J209 Acute bronchitis, unspecified: Secondary | ICD-10-CM | POA: Insufficient documentation

## 2013-05-28 DIAGNOSIS — R531 Weakness: Secondary | ICD-10-CM

## 2013-05-28 DIAGNOSIS — E1149 Type 2 diabetes mellitus with other diabetic neurological complication: Secondary | ICD-10-CM

## 2013-05-28 DIAGNOSIS — M25551 Pain in right hip: Secondary | ICD-10-CM

## 2013-05-28 DIAGNOSIS — I10 Essential (primary) hypertension: Secondary | ICD-10-CM

## 2013-05-28 DIAGNOSIS — R609 Edema, unspecified: Secondary | ICD-10-CM | POA: Insufficient documentation

## 2013-05-28 MED ORDER — FLUCONAZOLE 100 MG PO TABS: 100.0000 mg | ORAL_TABLET | Freq: Every day | ORAL | Status: DC

## 2013-05-28 MED ORDER — TORSEMIDE 20 MG PO TABS: ORAL_TABLET | ORAL | Status: DC

## 2013-05-28 MED ORDER — IPRATROPIUM-ALBUTEROL 18-103 MCG/ACT IN AERO: INHALATION_SPRAY | RESPIRATORY_TRACT | Status: DC

## 2013-05-28 NOTE — Progress Notes (Addendum)
Subjective:    Patient ID: [name] [name], female    DOB: 1961/04/25, 52 y.o.   MRN: [id number]  Chief Complaint  Patient presents with  . Hospitalization Follow-up    hospital f/u  . Leg Swelling    leg/feet swelling     HPI  Acute bronchitis : seen in ER 05/22/13. Treated with azithromycin and amoxicillin. She is starting to feel better. Still coughing.  Hypertension: controlled  Type II or unspecified type diabetes mellitus with neurological manifestations, uncontrolled(250.62); poor control  Weakness generalized: chronic. Worse with her acute bronchitis  Hip pain, right: chronic since accident in Aug 2014. X-ray in [facility] unremarkable  Crohn's disease: not active at present. Has been off prednisone for 2 months.  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  Current Outpatient Prescriptions on File Prior to Visit  Medication Sig Dispense Refill  . albuterol (PROVENTIL) (2.5 MG/3ML) 0.083% nebulizer solution Take 2.5 mg by nebulization every 6 (six) hours as needed. For shortness of breath      . ALPRAZolam (XANAX) 1 MG tablet Take one tablet three times a day for nerves and sleep at night time  90 tablet  3  . Aspirin-Salicylamide-Caffeine (BC HEADACHE POWDER PO) Take 1 Package by mouth every 6 (six) hours as needed (pain).      . Biotin 1000 MCG tablet Take 1,000 mcg by mouth daily.      . bumetanide (BUMEX) 2 MG tablet Take 2 mg by mouth daily as needed (edema control).      . chlorhexidine (PERIDEX) 0.12 % solution Use as directed 15 mLs in the mouth or throat daily.      . fluconazole (DIFLUCAN) 100 MG tablet Take 1 tablet (100 mg total) by mouth daily.  1 tablet  1  . glipiZIDE (GLUCOTROL XL) 5 MG 24 hr tablet Take 1 tablet (5 mg total) by mouth daily.  30 tablet  0  . lactulose (CHRONULAC)  10 GM/15ML solution Take 30 mLs (20 g total) by mouth daily as needed. For constipation  240 mL  1  . lisinopril (PRINIVIL,ZESTRIL) 10 MG tablet Take 10 mg by mouth daily. Take 1 tablet daily to control blood pressure.      . metFORMIN (GLUCOPHAGE) 1000 MG tablet Take one tablet twice a day for diabetes 250.00  60 tablet  5  . Oxycodone HCl 10 MG TABS Take one tablet up to 6 times as needed to control pain  180 tablet  0  . predniSONE (DELTASONE) 5 MG tablet Take 5 mg by mouth daily. Take 2 tablets daily for 1 week , then 1 daily to help inflammatory bowel disease.      . promethazine (PHENERGAN) 12.5 MG tablet Take 12.5 mg by mouth every 6 (six) hours as needed. Take 1 tablet every 6 hours as needed for nausea.      . simvastatin (ZOCOR) 20 MG tablet Take 20 mg by mouth every evening. Take 1 tablet to lower cholesterol.      Marland Kitchen spironolactone (ALDACTONE) 25 MG tablet Take 25 mg by mouth daily.      . Vilazodone HCl (VIIBRYD) 40 MG TABS Take 1 tablet (40 mg total) by mouth daily. Take one tablet once a day for depression  90 tablet  1   No current facility-administered medications on file prior to visit.  Review of Systems  Constitutional: Positive for activity change and fatigue. Negative for fever, chills, diaphoresis, appetite change and unexpected weight change.  HENT: Positive for congestion. Negative for ear pain, hearing loss, sinus pressure and sore throat.   Eyes: Negative.   Respiratory: Positive for cough, chest tightness and shortness of breath. Negative for wheezing and stridor.   Cardiovascular: Positive for leg swelling. Negative for chest pain and palpitations.  Gastrointestinal: Negative for nausea, vomiting, abdominal pain, diarrhea, constipation and abdominal distention.  Endocrine:       Hx DM  Musculoskeletal: Positive for back pain and gait problem (using walker).  Skin: Negative.   Allergic/Immunologic: Negative.   Neurological:       Numb feet  Hematological:  Negative.   Psychiatric/Behavioral: Positive for dysphoric mood. The patient is nervous/anxious.        Objective:BP 130/84  Pulse 102  Temp(Src) 98 F (36.7 C) (Oral)  Resp 16  Wt 212 lb 12.8 oz (96.525 kg)  SpO2 97%    Physical Exam  Constitutional: She is oriented to person, place, and time.  obese  HENT:  Head: Normocephalic and atraumatic.  Right Ear: External ear normal.  Left Ear: External ear normal.  Nose: Nose normal.  Mouth/Throat: Oropharynx is clear and moist.  Eyes: Conjunctivae and EOM are normal. Pupils are equal, round, and reactive to light.  Neck: No JVD present. No tracheal deviation present. No thyromegaly present.  Cardiovascular: Normal rate, regular rhythm, normal heart sounds and intact distal pulses.  Exam reveals no gallop and no friction rub.   No murmur heard. Pulmonary/Chest: No respiratory distress. She has no wheezes. She has no rales. She exhibits no tenderness.  Abdominal: She exhibits no distension and no mass. There is no tenderness.  Musculoskeletal: Normal range of motion. She exhibits edema. She exhibits no tenderness.  Lymphadenopathy:    She has no cervical adenopathy.  Neurological: She is alert and oriented to person, place, and time. No cranial nerve deficit. Coordination normal.  Skin: No rash noted. No erythema. No pallor.  Psychiatric: Her behavior is normal. Judgment and thought content normal.  Still depressed    Office Visit on 05/28/2013  Component Date Value Range Status  . HM Mammogram 07/17/2010 normal   Final  . HM Pap smear 04/24/2012 Dr Hughes Better, normal   Final  Admission on 05/22/2013, Discharged on 05/23/2013  Component Date Value Range Status  . WBC 05/22/2013 11.5* 4.0 - 10.5 K/uL Final  . RBC 05/22/2013 5.49* 3.87 - 5.11 MIL/uL Final  . Hemoglobin 05/22/2013 15.5* 12.0 - 15.0 g/dL Final  . HCT 16/04/9603 44.2  36.0 - 46.0 % Final  . MCV 05/22/2013 80.5  78.0 - 100.0 fL Final  . MCH 05/22/2013 28.2  26.0 -  34.0 pg Final  . MCHC 05/22/2013 35.1  30.0 - 36.0 g/dL Final  . RDW 54/03/8118 14.6  11.5 - 15.5 % Final  . Platelets 05/22/2013 268  150 - 400 K/uL Final  . Sodium 05/22/2013 135  135 - 145 mEq/L Final  . Potassium 05/22/2013 3.6  3.5 - 5.1 mEq/L Final  . Chloride 05/22/2013 101  96 - 112 mEq/L Final  . CO2 05/22/2013 26  19 - 32 mEq/L Final  . Glucose, Bld 05/22/2013 176* 70 - 99 mg/dL Final  . BUN 14/78/2956 13  6 - 23 mg/dL Final  . Creatinine, Ser 05/22/2013 0.59  0.50 - 1.10 mg/dL Final  . Calcium 21/30/8657 8.7  8.4 - 10.5 mg/dL Final  .  GFR calc non Af Amer 05/22/2013 >90  >90 mL/min Final  . GFR calc Af Amer 05/22/2013 >90  >90 mL/min Final   Comment: (NOTE)                          The eGFR has been calculated using the CKD EPI equation.                          This calculation has not been validated in all clinical situations.                          eGFR's persistently <90 mL/min signify possible Chronic Kidney                          Disease.  . Pro B Natriuretic peptide (BNP) 05/22/2013 22.2  0 - 125 pg/mL Final  . Troponin i, poc 05/22/2013 0.00  0.00 - 0.08 ng/mL Final  . Comment 3 05/22/2013          Final   Comment: Due to the release kinetics of cTnI,                          a negative result within the first hours                          of the onset of symptoms does not rule out                          myocardial infarction with certainty.                          If myocardial infarction is still suspected,                          repeat the test at appropriate intervals.  . Color, Urine 05/22/2013 YELLOW  YELLOW Final  . APPearance 05/22/2013 HAZY* CLEAR Final  . Specific Gravity, Urine 05/22/2013 1.023  1.005 - 1.030 Final  . pH 05/22/2013 6.0  5.0 - 8.0 Final  . Glucose, UA 05/22/2013 NEGATIVE  NEGATIVE mg/dL Final  . Hgb urine dipstick 05/22/2013 NEGATIVE  NEGATIVE Final  . Bilirubin Urine 05/22/2013 NEGATIVE  NEGATIVE Final  . Ketones, ur  05/22/2013 NEGATIVE  NEGATIVE mg/dL Final  . Protein, ur 60/45/4098 >300* NEGATIVE mg/dL Final  . Urobilinogen, UA 05/22/2013 0.2  0.0 - 1.0 mg/dL Final  . Nitrite 11/91/4782 NEGATIVE  NEGATIVE Final  . Leukocytes, UA 05/22/2013 NEGATIVE  NEGATIVE Final  . Squamous Epithelial / LPF 05/22/2013 RARE  RARE Final  . WBC, UA 05/22/2013 0-2  <3 WBC/hpf Final  . RBC / HPF 05/22/2013 0-2  <3 RBC/hpf Final  . Bacteria, UA 05/22/2013 FEW* RARE Final  . Casts 05/22/2013 HYALINE CASTS* NEGATIVE Final  . Urine-Other 05/22/2013 MUCOUS PRESENT   Final  . D-Dimer, Quant 05/22/2013 1.18* 0.00 - 0.48 ug/mL-FEU Final   Comment:                                 AT THE INHOUSE ESTABLISHED CUTOFF  VALUE OF 0.48 ug/mL FEU,                          THIS ASSAY HAS BEEN DOCUMENTED                          IN THE LITERATURE TO HAVE                          A SENSITIVITY AND NEGATIVE                          PREDICTIVE VALUE OF AT LEAST                          98 TO 99%.  THE TEST RESULT                          SHOULD BE CORRELATED WITH                          AN ASSESSMENT OF THE CLINICAL                          PROBABILITY OF DVT / VTE.  Appointment on 03/25/2013  Component Date Value Range Status  . Glucose 03/25/2013 133* 65 - 99 mg/dL Final  . BUN 16/04/9603 14  6 - 24 mg/dL Final  . Creatinine, Ser 03/25/2013 0.61  0.57 - 1.00 mg/dL Final  . GFR calc non Af Amer 03/25/2013 104  >59 mL/min/1.73 Final  . GFR calc Af Amer 03/25/2013 120  >59 mL/min/1.73 Final  . BUN/Creatinine Ratio 03/25/2013 23  9 - 23 Final  . Sodium 03/25/2013 143  134 - 144 mmol/L Final  . Potassium 03/25/2013 4.4  3.5 - 5.2 mmol/L Final  . Chloride 03/25/2013 100  97 - 108 mmol/L Final  . CO2 03/25/2013 23  18 - 29 mmol/L Final  . Calcium 03/25/2013 9.4  8.7 - 10.2 mg/dL Final  . Hemoglobin V4U 03/25/2013 7.7* 4.8 - 5.6 % Final   Comment:          Increased risk for diabetes: 5.7 - 6.4                                    Diabetes: >6.4                                   Glycemic control for adults with diabetes: <7.0  . Estimated average glucose 03/25/2013 174   Final         Assessment & Plan:  Acute bronchitis: finish antibiotics   - Plan: fluconazole (DIFLUCAN) 100 MG tablet, albuterol-ipratropium (COMBIVENT) 18-103 MCG/ACT inhaler  Hypertension: controlled  Type II or unspecified type diabetes mellitus with neurological manifestations, uncontrolled(250.62): no change in medication. Has been off prednisone 2 months.   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

## 2013-05-28 NOTE — Patient Instructions (Signed)
Use up Bumex by taking 2 tablets each morning, then change to Demadex 20 mg daily.  Stay off prednisone.

## 2013-06-19 ENCOUNTER — Other Ambulatory Visit: Payer: Medicare Other

## 2013-06-19 ENCOUNTER — Other Ambulatory Visit: Payer: Self-pay | Admitting: *Deleted

## 2013-06-19 DIAGNOSIS — E785 Hyperlipidemia, unspecified: Secondary | ICD-10-CM

## 2013-06-19 DIAGNOSIS — E1149 Type 2 diabetes mellitus with other diabetic neurological complication: Secondary | ICD-10-CM

## 2013-06-19 DIAGNOSIS — I10 Essential (primary) hypertension: Secondary | ICD-10-CM

## 2013-06-19 MED ORDER — OXYCODONE HCL 10 MG PO TABS: ORAL_TABLET | ORAL | Status: DC

## 2013-06-19 MED ORDER — ALPRAZOLAM 1 MG PO TABS: ORAL_TABLET | ORAL | Status: DC

## 2013-06-19 NOTE — Telephone Encounter (Signed)
Patient is due tomorrow but is here today for labwork and wanted to pick it up so she didn't have to come back because she depends on someone else to bring her.

## 2013-06-20 LAB — COMPREHENSIVE METABOLIC PANEL
AST: 12 IU/L (ref 0–40)
Albumin/Globulin Ratio: 0.7 — ABNORMAL LOW (ref 1.1–2.5)
Albumin: 1.7 g/dL — CL (ref 3.5–5.5)
Alkaline Phosphatase: 94 IU/L (ref 39–117)
BUN/Creatinine Ratio: 29 — ABNORMAL HIGH (ref 9–23)
BUN: 14 mg/dL (ref 6–24)
CO2: 26 mmol/L (ref 18–29)
Creatinine, Ser: 0.48 mg/dL — ABNORMAL LOW (ref 0.57–1.00)
Globulin, Total: 2.4 g/dL (ref 1.5–4.5)
Potassium: 4.9 mmol/L (ref 3.5–5.2)

## 2013-06-20 LAB — CBC WITH DIFFERENTIAL
Eos: 5 %
Hemoglobin: 16 g/dL — ABNORMAL HIGH (ref 11.1–15.9)
Immature Grans (Abs): 0 10*3/uL (ref 0.0–0.1)
Immature Granulocytes: 0 %
Lymphs: 41 %
Monocytes Absolute: 0.5 10*3/uL (ref 0.1–0.9)
Monocytes: 6 %
Neutrophils Relative %: 48 %
Platelets: 292 10*3/uL (ref 150–379)
RBC: 5.67 x10E6/uL — ABNORMAL HIGH (ref 3.77–5.28)
WBC: 7.9 10*3/uL (ref 3.4–10.8)

## 2013-06-20 LAB — LIPID PANEL
Chol/HDL Ratio: 17.4 ratio units — ABNORMAL HIGH (ref 0.0–4.4)
Cholesterol, Total: 645 mg/dL (ref 100–199)

## 2013-06-20 LAB — HEMOGLOBIN A1C
Est. average glucose Bld gHb Est-mCnc: 214 mg/dL
Hgb A1c MFr Bld: 9.1 % — ABNORMAL HIGH (ref 4.8–5.6)

## 2013-06-26 ENCOUNTER — Inpatient Hospital Stay (HOSPITAL_COMMUNITY)
Admission: AD | Admit: 2013-06-26 | Discharge: 2013-07-01 | DRG: 699 | Disposition: A | Discharge status: Home / Self Care | Payer: Medicare Other | Source: Ambulatory Visit | Attending: Internal Medicine | Admitting: Internal Medicine

## 2013-06-26 ENCOUNTER — Inpatient Hospital Stay (HOSPITAL_COMMUNITY): Payer: Medicare Other

## 2013-06-26 ENCOUNTER — Encounter: Payer: Self-pay | Admitting: Nurse Practitioner

## 2013-06-26 ENCOUNTER — Ambulatory Visit (INDEPENDENT_AMBULATORY_CARE_PROVIDER_SITE_OTHER): Payer: Medicare Other | Admitting: Nurse Practitioner

## 2013-06-26 VITALS — BP 148/90 | HR 102 | Temp 98.9°F | Resp 22 | Ht 61.0 in | Wt 240.0 lb

## 2013-06-26 DIAGNOSIS — R531 Weakness: Secondary | ICD-10-CM

## 2013-06-26 DIAGNOSIS — K219 Gastro-esophageal reflux disease without esophagitis: Secondary | ICD-10-CM

## 2013-06-26 DIAGNOSIS — K5090 Crohn's disease, unspecified, without complications: Secondary | ICD-10-CM

## 2013-06-26 DIAGNOSIS — G8100 Flaccid hemiplegia affecting unspecified side: Secondary | ICD-10-CM | POA: Diagnosis present

## 2013-06-26 DIAGNOSIS — J4489 Other specified chronic obstructive pulmonary disease: Secondary | ICD-10-CM | POA: Diagnosis present

## 2013-06-26 DIAGNOSIS — R609 Edema, unspecified: Secondary | ICD-10-CM

## 2013-06-26 DIAGNOSIS — Z6841 Body Mass Index (BMI) 40.0 and over, adult: Secondary | ICD-10-CM

## 2013-06-26 DIAGNOSIS — Z881 Allergy status to other antibiotic agents status: Secondary | ICD-10-CM

## 2013-06-26 DIAGNOSIS — E039 Hypothyroidism, unspecified: Secondary | ICD-10-CM | POA: Diagnosis present

## 2013-06-26 DIAGNOSIS — R809 Proteinuria, unspecified: Secondary | ICD-10-CM | POA: Diagnosis present

## 2013-06-26 DIAGNOSIS — M1990 Unspecified osteoarthritis, unspecified site: Secondary | ICD-10-CM | POA: Diagnosis present

## 2013-06-26 DIAGNOSIS — M545 Low back pain, unspecified: Secondary | ICD-10-CM | POA: Diagnosis present

## 2013-06-26 DIAGNOSIS — Z79899 Other long term (current) drug therapy: Secondary | ICD-10-CM

## 2013-06-26 DIAGNOSIS — F411 Generalized anxiety disorder: Secondary | ICD-10-CM | POA: Diagnosis present

## 2013-06-26 DIAGNOSIS — J449 Chronic obstructive pulmonary disease, unspecified: Secondary | ICD-10-CM | POA: Diagnosis present

## 2013-06-26 DIAGNOSIS — E785 Hyperlipidemia, unspecified: Secondary | ICD-10-CM | POA: Diagnosis present

## 2013-06-26 DIAGNOSIS — F17200 Nicotine dependence, unspecified, uncomplicated: Secondary | ICD-10-CM | POA: Diagnosis present

## 2013-06-26 DIAGNOSIS — I2510 Atherosclerotic heart disease of native coronary artery without angina pectoris: Secondary | ICD-10-CM | POA: Diagnosis present

## 2013-06-26 DIAGNOSIS — Z823 Family history of stroke: Secondary | ICD-10-CM

## 2013-06-26 DIAGNOSIS — E1140 Type 2 diabetes mellitus with diabetic neuropathy, unspecified: Secondary | ICD-10-CM

## 2013-06-26 DIAGNOSIS — J209 Acute bronchitis, unspecified: Secondary | ICD-10-CM

## 2013-06-26 DIAGNOSIS — I10 Essential (primary) hypertension: Secondary | ICD-10-CM | POA: Diagnosis present

## 2013-06-26 DIAGNOSIS — E1149 Type 2 diabetes mellitus with other diabetic neurological complication: Secondary | ICD-10-CM | POA: Diagnosis present

## 2013-06-26 DIAGNOSIS — M1710 Unilateral primary osteoarthritis, unspecified knee: Secondary | ICD-10-CM

## 2013-06-26 DIAGNOSIS — E8809 Other disorders of plasma-protein metabolism, not elsewhere classified: Secondary | ICD-10-CM | POA: Diagnosis present

## 2013-06-26 DIAGNOSIS — K056 Periodontal disease, unspecified: Secondary | ICD-10-CM

## 2013-06-26 DIAGNOSIS — R601 Generalized edema: Secondary | ICD-10-CM

## 2013-06-26 DIAGNOSIS — R0602 Shortness of breath: Secondary | ICD-10-CM | POA: Diagnosis present

## 2013-06-26 DIAGNOSIS — F329 Major depressive disorder, single episode, unspecified: Secondary | ICD-10-CM

## 2013-06-26 DIAGNOSIS — F3289 Other specified depressive episodes: Secondary | ICD-10-CM | POA: Diagnosis present

## 2013-06-26 DIAGNOSIS — Z720 Tobacco use: Secondary | ICD-10-CM

## 2013-06-26 DIAGNOSIS — K589 Irritable bowel syndrome without diarrhea: Secondary | ICD-10-CM

## 2013-06-26 DIAGNOSIS — N049 Nephrotic syndrome with unspecified morphologic changes: Secondary | ICD-10-CM

## 2013-06-26 HISTORY — DX: Proteinuria, unspecified: R80.9

## 2013-06-26 LAB — URINALYSIS, ROUTINE W REFLEX MICROSCOPIC
Bilirubin Urine: NEGATIVE
Glucose, UA: NEGATIVE mg/dL
Ketones, ur: NEGATIVE mg/dL
Leukocytes, UA: NEGATIVE
pH: 5.5 (ref 5.0–8.0)

## 2013-06-26 LAB — COMPREHENSIVE METABOLIC PANEL
Albumin: 0.8 g/dL — ABNORMAL LOW (ref 3.5–5.2)
BUN: 16 mg/dL (ref 6–23)
CO2: 23 mEq/L (ref 19–32)
Calcium: 8.3 mg/dL — ABNORMAL LOW (ref 8.4–10.5)
Creatinine, Ser: 0.6 mg/dL (ref 0.50–1.10)
GFR calc Af Amer: >90 mL/min (ref >90)
GFR calc non Af Amer: >90 mL/min (ref >90)
Glucose, Bld: 93 mg/dL (ref 70–99)
Sodium: 137 mEq/L (ref 135–145)

## 2013-06-26 LAB — GLUCOSE, CAPILLARY
Glucose-Capillary: 132 mg/dL — ABNORMAL HIGH (ref 70–99)
Glucose-Capillary: 168 mg/dL — ABNORMAL HIGH (ref 70–99)

## 2013-06-26 LAB — CBC
Hemoglobin: 15.8 g/dL — ABNORMAL HIGH (ref 12.0–15.0)
MCH: 28.9 pg (ref 26.0–34.0)
MCHC: 35.2 g/dL (ref 30.0–36.0)
MCV: 82.1 fL (ref 78.0–100.0)
Platelets: 285 10*3/uL (ref 150–400)
RBC: 5.47 MIL/uL — ABNORMAL HIGH (ref 3.87–5.11)
RDW: 14.6 % (ref 11.5–15.5)

## 2013-06-26 LAB — TROPONIN I: Troponin I: <0.3 ng/mL (ref <0.30)

## 2013-06-26 LAB — URINE MICROSCOPIC-ADD ON

## 2013-06-26 LAB — RAPID URINE DRUG SCREEN, HOSP PERFORMED
Benzodiazepines: NOT DETECTED
Cocaine: NOT DETECTED

## 2013-06-26 MED ORDER — NICOTINE 21 MG/24HR TD PT24
21.0000 mg | MEDICATED_PATCH | Freq: Every day | TRANSDERMAL | Status: DC
  Filled 2013-06-26: qty 1

## 2013-06-26 MED ORDER — VILAZODONE HCL 40 MG PO TABS
40.0000 mg | ORAL_TABLET | Freq: Every day | ORAL | Status: DC
  Filled 2013-06-26 (×2): qty 1

## 2013-06-26 MED ORDER — ALPRAZOLAM 0.5 MG PO TABS: 1.0000 mg | ORAL_TABLET | Freq: Three times a day (TID) | ORAL | Status: DC | PRN

## 2013-06-26 MED ORDER — INSULIN ASPART 100 UNIT/ML ~~LOC~~ SOLN
0.0000 [IU] | Freq: Three times a day (TID) | SUBCUTANEOUS | Status: DC
  Administered 2013-06-26 – 2013-06-28 (×4): 2 [IU] via SUBCUTANEOUS
  Administered 2013-06-29: 18:00:00 3 [IU] via SUBCUTANEOUS
  Administered 2013-06-29: 06:00:00 2 [IU] via SUBCUTANEOUS
  Administered 2013-06-29 – 2013-06-30 (×2): 3 [IU] via SUBCUTANEOUS

## 2013-06-26 MED ORDER — LISINOPRIL 10 MG PO TABS
10.0000 mg | ORAL_TABLET | Freq: Every day | ORAL | Status: DC
  Administered 2013-06-26 – 2013-06-27 (×2): 10 mg via ORAL
  Filled 2013-06-26 (×2): qty 1

## 2013-06-26 MED ORDER — SODIUM CHLORIDE 0.9 % IJ SOLN
3.0000 mL | Freq: Two times a day (BID) | INTRAMUSCULAR | Status: DC
  Administered 2013-06-26 – 2013-07-01 (×9): 3 mL via INTRAVENOUS

## 2013-06-26 MED ORDER — SODIUM CHLORIDE 0.9 % IV SOLN: 250.0000 mL | INTRAVENOUS | Status: DC | PRN

## 2013-06-26 MED ORDER — ONDANSETRON HCL 4 MG PO TABS: 4.0000 mg | ORAL_TABLET | Freq: Four times a day (QID) | ORAL | Status: DC | PRN

## 2013-06-26 MED ORDER — SODIUM CHLORIDE 0.9 % IJ SOLN: 3.0000 mL | INTRAMUSCULAR | Status: DC | PRN

## 2013-06-26 MED ORDER — ALBUTEROL SULFATE (5 MG/ML) 0.5% IN NEBU: 2.5000 mg | INHALATION_SOLUTION | RESPIRATORY_TRACT | Status: DC | PRN

## 2013-06-26 MED ORDER — ENOXAPARIN SODIUM 40 MG/0.4ML ~~LOC~~ SOLN
40.0000 mg | SUBCUTANEOUS | Status: DC
Start: 2013-06-26 — End: 2013-06-28
  Administered 2013-06-26 – 2013-06-27 (×2): 40 mg via SUBCUTANEOUS
  Filled 2013-06-26 (×3): qty 0.4

## 2013-06-26 MED ORDER — ONDANSETRON HCL 4 MG/2ML IJ SOLN
4.0000 mg | Freq: Four times a day (QID) | INTRAMUSCULAR | Status: DC | PRN
  Administered 2013-06-27: 4 mg via INTRAVENOUS
  Filled 2013-06-26: qty 2

## 2013-06-26 MED ORDER — NICOTINE 14 MG/24HR TD PT24
14.0000 mg | MEDICATED_PATCH | Freq: Every day | TRANSDERMAL | Status: DC
  Administered 2013-06-26: 14 mg via TRANSDERMAL
  Filled 2013-06-26: qty 1

## 2013-06-26 MED ORDER — SODIUM CHLORIDE 0.9 % IJ SOLN
3.0000 mL | Freq: Two times a day (BID) | INTRAMUSCULAR | Status: DC
  Administered 2013-06-27 – 2013-07-01 (×4): 3 mL via INTRAVENOUS

## 2013-06-26 MED ORDER — INSULIN ASPART 100 UNIT/ML ~~LOC~~ SOLN
0.0000 [IU] | Freq: Every day | SUBCUTANEOUS | Status: DC
Start: 2013-06-26 — End: 2013-06-30

## 2013-06-26 MED ORDER — FUROSEMIDE 10 MG/ML IJ SOLN: 40.0000 mg | Freq: Two times a day (BID) | INTRAMUSCULAR | Status: DC

## 2013-06-26 MED ORDER — HYDROCODONE-ACETAMINOPHEN 5-325 MG PO TABS
1.0000 | ORAL_TABLET | ORAL | Status: DC | PRN
  Administered 2013-06-26: 2 via ORAL
  Filled 2013-06-26: qty 2

## 2013-06-26 MED ORDER — FUROSEMIDE 10 MG/ML IJ SOLN
40.0000 mg | Freq: Every day | INTRAMUSCULAR | Status: DC
  Administered 2013-06-26 – 2013-06-27 (×2): 40 mg via INTRAVENOUS
  Filled 2013-06-26 (×2): qty 4

## 2013-06-26 NOTE — Progress Notes (Signed)
*  PRELIMINARY RESULTS* Echocardiogram 2D Echocardiogram has been performed.  [name] [name] 06/26/2013, 4:40 PM

## 2013-06-26 NOTE — H&P (Signed)
[facility] History and Physical  [name] [name] YNW:[id number] DOB: 11-May-1961 DOA: 06/26/2013  Referring physician: EDP PCP: Kimber Relic, MD   Chief Complaint: weight gain  HPI: [name] [name] is a 52 y.o. female  With multiple medical problems comes in for the last 2 months, she has had increasing swelling- 30lbs.  She was started on demadex and aldactone by here PCP.  This helped increase her urination for 1 day but then stopped working per patient. She was seen in the ER on Nov 6th with protein in urine and PNA. She is unable to lay flat  Last saw Dr. Allyson Sabal in 2009 where she had a cath- reported to be 70% stenosis  She reports occasional chest pain- comes and goes.  This AM, she had increasing SOB and was seen by her PCP and sent to hospital for further work up   Review of Systems: Systems reviewed.  As above, otherwise negative  Past Medical History  Diagnosis Date  . Crohn's disease     hx of crohn's dz  . COPD (chronic obstructive pulmonary disease)   . Coronary artery disease   . Hypertension   . Diabetes mellitus   . Thyroid disease     hypothyroidism  . Arthritis   . Asthma   . AC (acromioclavicular) joint bone spurs     neck  . Depressed   . Nausea alone   . Unspecified gingival and periodontal disease   . Palpitations   . Allergic rhinitis due to pollen   . Methicillin resistant Staphylococcus aureus septicemia   . Acute bronchitis   . Type II or unspecified type diabetes mellitus without mention of complication, uncontrolled   . Carbuncle and furuncle of trunk   . Abdominal pain, unspecified site   . Sacroiliitis, not elsewhere classified   . Hyperventilation   . Flaccid hemiplegia affecting dominant side   . Pain in joint, pelvic region and thigh   . Cough   . Disturbance of skin sensation   . Herpes zoster with unspecified nervous system complication   . Chest pain, unspecified   . Nonspecific abnormal electrocardiogram (ECG)  (EKG)   . Candidiasis of mouth   . Hyperlipidemia   . Tobacco use disorder   . Regional enteritis of large intestine   . Osteoarthrosis, unspecified whether generalized or localized, unspecified site   . Intrinsic asthma, unspecified   . Lumbago   . Blood in stool   . Unspecified nontoxic nodular goiter   . Anxiety   . Edema   . Abdominal pain, right lower quadrant   . Dysmenorrhea   . Unspecified urinary incontinence   . Regional enteritis of unspecified site   . Type II or unspecified type diabetes mellitus without mention of complication, not stated as uncontrolled   . GERD (gastroesophageal reflux disease)   . Other malaise and fatigue   . Encounter for long-term (current) use of other medications   . Unspecified constipation   . Irritable bowel syndrome   . Diaphragmatic hernia without mention of obstruction or gangrene    Past Surgical History  Procedure Laterality Date  . Metatarsal reconstruction  1993  . Tubalization  1982  . Right foot surgery in 1993 twice    . Right foot surgery in 1995    . Colonoscopy  09/10/2006   Social History:  reports that she has been smoking.  She does not have any smokeless tobacco history on file. She reports that she does not  drink alcohol or use illicit drugs.  Allergies  Allergen Reactions  . Avelox [Moxifloxacin Hcl In Nacl] Swelling  . Chantix [Varenicline] Nausea Only  . Imipramine Hcl   . Tetracyclines & Related Swelling    Family History  Problem Relation Age of Onset  . Stroke Mother      Prior to Admission medications   Medication Sig Start Date End Date Taking? Authorizing Provider  albuterol (PROVENTIL) (2.5 MG/3ML) 0.083% nebulizer solution Take 2.5 mg by nebulization every 6 (six) hours as needed. For shortness of breath    Historical Provider, MD  albuterol-ipratropium (COMBIVENT) 18-103 MCG/ACT inhaler 2 puffs every 6 hours if needed to help breathing 05/28/13   Kimber Relic, MD  ALPRAZolam Prudy Feeler) 1 MG  tablet Take one tablet three times a day for nerves and sleep at night time 06/19/13   Kermit Balo, DO  Aspirin-Salicylamide-Caffeine (BC HEADACHE POWDER PO) Take 1 Package by mouth every 6 (six) hours as needed (pain).    Historical Provider, MD  chlorhexidine (PERIDEX) 0.12 % solution Use as directed 15 mLs in the mouth or throat daily.    Historical Provider, MD  fluconazole (DIFLUCAN) 100 MG tablet Take 1 tablet (100 mg total) by mouth daily. One daily to treat yeast infection 05/28/13   Kimber Relic, MD  glipiZIDE (GLUCOTROL XL) 5 MG 24 hr tablet Take 1 tablet (5 mg total) by mouth daily. 11/07/12   Claudie Revering, NP  lactulose (CHRONULAC) 10 GM/15ML solution Take 30 mLs (20 g total) by mouth daily as needed. For constipation 03/27/13   Claudie Revering, NP  lisinopril (PRINIVIL,ZESTRIL) 10 MG tablet Take 10 mg by mouth daily. Take 1 tablet daily to control blood pressure.    Historical Provider, MD  metFORMIN (GLUCOPHAGE) 1000 MG tablet Take one tablet twice a day for diabetes 250.00 11/22/12   Kimber Relic, MD  Oxycodone HCl 10 MG TABS Take one tablet up to 6 times as needed to control pain 06/19/13   Tiffany L Reed, DO  promethazine (PHENERGAN) 12.5 MG tablet Take 12.5 mg by mouth every 6 (six) hours as needed. Take 1 tablet every 6 hours as needed for nausea. 09/12/12   Historical Provider, MD  simvastatin (ZOCOR) 20 MG tablet Take 20 mg by mouth every evening. Take 1 tablet to lower cholesterol.    Historical Provider, MD  spironolactone (ALDACTONE) 25 MG tablet Take 25 mg by mouth daily.    Historical Provider, MD  torsemide (DEMADEX) 20 MG tablet One daily to control edema 05/28/13   Kimber Relic, MD  Vilazodone HCl (VIIBRYD) 40 MG TABS Take 1 tablet (40 mg total) by mouth daily. Take one tablet once a day for depression 03/27/13   Claudie Revering, NP   Physical Exam: Filed Vitals:   06/26/13 1216  BP: 147/100  Pulse: 117  Temp: 98.4 F (36.9 C)  Resp: 20    BP 147/100  Pulse  117  Temp(Src) 98.4 F (36.9 C) (Oral)  Resp 20  Ht 5\' 1"  (1.549 m)  Wt 107.6 kg (237 lb 3.4 oz)  BMI 44.84 kg/m2  SpO2 98%   General Appearance:    Alert, cooperative, no distress, appears stated age  Head:    Normocephalic, without obvious abnormality, atraumatic  Eyes:    PERRL, conjunctiva/corneas clear, EOM's intact, fundi    benign, both eyes  Ears:    Normal TM's and external ear canals, both ears  Nose:   Nares normal,  septum midline, mucosa normal, no drainage    or sinus tenderness  Throat:   Lips, mucosa, and tongue normal; teeth and gums normal  Neck:   Supple, symmetrical, trachea midline, no adenopathy;    thyroid:  no enlargement/tenderness/nodules; no carotid   bruit or JVD  Back:     Symmetric, no curvature, ROM normal, no CVA tenderness  Lungs:     Crackles at bases  Chest Wall:    No tenderness or deformity   Heart:    Regular rate and rhythm, S1 and S2 normal, no murmur, rub   or gallop     Abdomen:     Soft, non-tender, bowel sounds active all four quadrants,    no masses, distended           Pulses:   2+ and symmetric all extremities  Skin:   +edema- to mid thigh  Lymph nodes:   Cervical, supraclavicular, and axillary nodes normal  Neurologic:   CNII-XII intact, normal strength, sensation and reflexes    throughout             Labs on Admission:  Basic Metabolic Panel: No results found for this basename: NA, K, CL, CO2, GLUCOSE, BUN, CREATININE, CALCIUM, MG, PHOS,  in the last 168 hours Liver Function Tests: No results found for this basename: AST, ALT, ALKPHOS, BILITOT, PROT, ALBUMIN,  in the last 168 hours No results found for this basename: LIPASE, AMYLASE,  in the last 168 hours No results found for this basename: AMMONIA,  in the last 168 hours CBC: No results found for this basename: WBC, NEUTROABS, HGB, HCT, MCV, PLT,  in the last 168 hours Cardiac Enzymes: No results found for this basename: CKTOTAL, CKMB, CKMBINDEX, TROPONINI,  in the  last 168 hours  BNP (last 3 results)  Recent Labs  05/22/13 1811  PROBNP 22.2   CBG: No results found for this basename: GLUCAP,  in the last 168 hours  Radiological Exams on Admission: No results found.  EKG: pending  Assessment/Plan Active Problems:   Hypertension   Type II or unspecified type diabetes mellitus with neurological manifestations, uncontrolled(250.62)   Edema   SOB (shortness of breath)   Tobacco abuse   1. SOB due to edema- suspect CHF vs nephrotitic syndrome- IV lasix, echo, cycle CE, check EKG 2. tobacco abuse- nicotine patch 3. DM- SSI, HgbA1C elevated 4. HTN- IV lasix and monitor 5. Edema- IV lasix 6. Proteinuria- check U/A and 24 hour urine 7. Hypothyroid- check TSH    Code Status: full Family Communication: all updated at bedside Disposition Plan:   Time spent: 75 min  Marlin Canary [facility] Pager [phone]

## 2013-06-26 NOTE — Patient Instructions (Signed)
Please go to directly [place] ADMISSIONS to be directed to your inpatient room

## 2013-06-26 NOTE — Progress Notes (Signed)
Patient expressed need for higher dose of nicotene patch. Patient states she smokes greater than 1 pack per day. NP Claiborne Billings made aware. Awaiting new orders. RN will continue to monitor. Louretta Parma, RN

## 2013-06-26 NOTE — Progress Notes (Signed)
Patient ID: [name] [name], female   DOB: Dec 30, 1960, 52 y.o.   MRN: [id number]    Allergies  Allergen Reactions  . Avelox [Moxifloxacin Hcl In Nacl] Swelling  . Chantix [Varenicline] Nausea Only  . Imipramine Hcl   . Tetracyclines & Related Swelling    Chief Complaint  Patient presents with  . Medical Managment of Chronic Issues    3 month follow-up   . Edema    Patient with severe abdominal swelling,leg swelling , wrist swelling, facial swelling- ? related to Chronh's   . Orders    Discuss order for Hospital Bed- [facility]     HPI: Patient is a 52 y.o. female seen in the office today for severe swelling; Started 2 months ago, in legs, feet and arms, last month abdomen started to swell, family has told her to go to the hospital but [name] [name] is putting this off Fluid pills was changed this has not helped-- taking Demadex 20mg  daily and aldactone daily Increased shortness of breath and had chest pain this morning Unable to walk due to swelling  Pt reports [name] [name] is not able to have a BM, will have small BM and then swelling gets worse Went to ED Nov 6th and they told her [name] [name] had protein in urine and pneumonia Blood sugars have been 97s-130s; no low blood sugars  Has not been taking zocor Review of Systems:  Review of Systems  Constitutional: Positive for malaise/fatigue. Negative for fever and chills.  Respiratory: Positive for shortness of breath.   Cardiovascular: Positive for chest pain, orthopnea, leg swelling and PND.  Gastrointestinal: Positive for abdominal pain and constipation.  Musculoskeletal: Positive for myalgias.  Skin: Negative.   Neurological: Positive for weakness.     Past Medical History  Diagnosis Date  . Crohn's disease     hx of crohn's dz  . COPD (chronic obstructive pulmonary disease)   . Coronary artery disease   . Hypertension   . Diabetes mellitus   . Thyroid disease     hypothyroidism  . Arthritis   . Asthma   . AC  (acromioclavicular) joint bone spurs     neck  . Depressed   . Nausea alone   . Unspecified gingival and periodontal disease   . Palpitations   . Allergic rhinitis due to pollen   . Methicillin resistant Staphylococcus aureus septicemia   . Acute bronchitis   . Type II or unspecified type diabetes mellitus without mention of complication, uncontrolled   . Carbuncle and furuncle of trunk   . Abdominal pain, unspecified site   . Sacroiliitis, not elsewhere classified   . Hyperventilation   . Flaccid hemiplegia affecting dominant side   . Pain in joint, pelvic region and thigh   . Cough   . Disturbance of skin sensation   . Herpes zoster with unspecified nervous system complication   . Chest pain, unspecified   . Nonspecific abnormal electrocardiogram (ECG) (EKG)   . Candidiasis of mouth   . Hyperlipidemia   . Tobacco use disorder   . Regional enteritis of large intestine   . Osteoarthrosis, unspecified whether generalized or localized, unspecified site   . Intrinsic asthma, unspecified   . Lumbago   . Blood in stool   . Unspecified nontoxic nodular goiter   . Anxiety   . Edema   . Abdominal pain, right lower quadrant   . Dysmenorrhea   . Unspecified urinary incontinence   . Regional enteritis of unspecified site   .  Type II or unspecified type diabetes mellitus without mention of complication, not stated as uncontrolled   . GERD (gastroesophageal reflux disease)   . Other malaise and fatigue   . Encounter for long-term (current) use of other medications   . Unspecified constipation   . Irritable bowel syndrome   . Diaphragmatic hernia without mention of obstruction or gangrene    Past Surgical History  Procedure Laterality Date  . Metatarsal reconstruction  1993  . Tubalization  1982  . Right foot surgery in 1993 twice    . Right foot surgery in 1995    . Colonoscopy  09/10/2006   Social History:   reports that [name] [name] has been smoking.  [name] [name] does not have any smokeless  tobacco history on file. [name] [name] reports that [name] [name] does not drink alcohol or use illicit drugs.  Family History  Problem Relation Age of Onset  . Stroke Mother     Medications: Patient's Medications  New Prescriptions   No medications on file  Previous Medications   ALBUTEROL (PROVENTIL) (2.5 MG/3ML) 0.083% NEBULIZER SOLUTION    Take 2.5 mg by nebulization every 6 (six) hours as needed. For shortness of breath   ALBUTEROL-IPRATROPIUM (COMBIVENT) 18-103 MCG/ACT INHALER    2 puffs every 6 hours if needed to help breathing   ALPRAZOLAM (XANAX) 1 MG TABLET    Take one tablet three times a day for nerves and sleep at night time   ASPIRIN-SALICYLAMIDE-CAFFEINE (BC HEADACHE POWDER PO)    Take 1 Package by mouth every 6 (six) hours as needed (pain).   CHLORHEXIDINE (PERIDEX) 0.12 % SOLUTION    Use as directed 15 mLs in the mouth or throat daily.   FLUCONAZOLE (DIFLUCAN) 100 MG TABLET    Take 1 tablet (100 mg total) by mouth daily. One daily to treat yeast infection   GLIPIZIDE (GLUCOTROL XL) 5 MG 24 HR TABLET    Take 1 tablet (5 mg total) by mouth daily.   LACTULOSE (CHRONULAC) 10 GM/15ML SOLUTION    Take 30 mLs (20 g total) by mouth daily as needed. For constipation   LISINOPRIL (PRINIVIL,ZESTRIL) 10 MG TABLET    Take 10 mg by mouth daily. Take 1 tablet daily to control blood pressure.   METFORMIN (GLUCOPHAGE) 1000 MG TABLET    Take one tablet twice a day for diabetes 250.00   OXYCODONE HCL 10 MG TABS    Take one tablet up to 6 times as needed to control pain   PROMETHAZINE (PHENERGAN) 12.5 MG TABLET    Take 12.5 mg by mouth every 6 (six) hours as needed. Take 1 tablet every 6 hours as needed for nausea.   SIMVASTATIN (ZOCOR) 20 MG TABLET    Take 20 mg by mouth every evening. Take 1 tablet to lower cholesterol.   SPIRONOLACTONE (ALDACTONE) 25 MG TABLET    Take 25 mg by mouth daily.   TORSEMIDE (DEMADEX) 20 MG TABLET    One daily to control edema   VILAZODONE HCL (VIIBRYD) 40 MG TABS    Take 1 tablet  (40 mg total) by mouth daily. Take one tablet once a day for depression  Modified Medications   No medications on file  Discontinued Medications   BIOTIN 1000 MCG TABLET    Take 1,000 mcg by mouth daily.   GENERLAC 10 GM/15ML SOLN         Physical Exam:  Filed Vitals:   06/26/13 1012  BP: 148/90  Pulse: 102  Temp: 98.9 F (37.2 C)  TempSrc: Oral  Resp: 14  Height: 5\' 1"  (1.549 m)  Weight: 240 lb (108.863 kg)  SpO2: 99%   Physical Exam  Constitutional: [name] [name] is well-developed, well-nourished, and in no distress.  Neck: JVD (mild) present.  Cardiovascular: Normal rate, regular rhythm and normal heart sounds.   Pulmonary/Chest: Effort normal. No respiratory distress.  Diminished breath sounds throughout  Abdominal: Bowel sounds are normal. [name] [name] exhibits distension.  abdominal with pitting edema  Musculoskeletal: [name] [name] exhibits edema (pitting edema into bilateral thighs, bilateral arms, and abd.).  Neurological: [name] [name] is alert.  Skin: Skin is warm and dry. [name] [name] is not diaphoretic.  Psychiatric: Affect normal.     Labs reviewed: Basic Metabolic Panel:  Recent Labs  16/10/96 0913 05/22/13 1811 06/19/13 0925  NA 143 135 142  K 4.4 3.6 4.9  CL 100 101 105  CO2 23 26 26   GLUCOSE 133* 176* 127*  BUN 14 13 14   CREATININE 0.61 0.59 0.48*  CALCIUM 9.4 8.7 8.3*   Liver Function Tests:  Recent Labs  10/31/12 1144 06/19/13 0925  AST 13 12  ALT 22 6  ALKPHOS 87 94  BILITOT 0.1 <0.2  PROT 6.5 4.1*   No results found for this basename: LIPASE, AMYLASE,  in the last 8760 hours No results found for this basename: AMMONIA,  in the last 8760 hours CBC:  Recent Labs  08/18/12 1041 10/31/12 1144 05/22/13 1811 06/19/13 0925  WBC 9.8 9.5 11.5* 7.9  NEUTROABS 5.8 5.3  --  3.8  HGB 14.0 14.1 15.5* 16.0*  HCT 41.6 43.3 44.2 46.9*  MCV 80.9 83 80.5 83  PLT 261  --  268 292   Lipid Panel:  Recent Labs  06/19/13 0925  HDL 37*  LDLCALC Comment  TRIG 429*  CHOLHDL  17.4*   TSH: No results found for this basename: TSH,  in the last 8760 hours A1C: Lab Results  Component Value Date   HGBA1C 9.1* 06/19/2013     Assessment/Plan 1. Type II or unspecified type diabetes mellitus with neurological manifestations, uncontrolled(250.62) -A1c is worse, stressed medication compliance and proper diet  2. Other and unspecified hyperlipidemia -has not been taking zocor -went over lipid panel with pt -educated on need to take medication   3. Anasarca -severe with shortness of breath and tachycardia -pt with 30 lbs weight gain since last visit 1 month ago -pts last echo in 08, has not followed but with cardiology -PO diuretics have clearly not been successful -Dr Renato Gails, DO in clinic and evaluated pt where it was determined [name] [name] needs to go to hospital for further evaluation and cardiac workup and IV diuresis and pt is willing to go to the hospital at this time  -called hospitalist for direct admission pt with daughter and instructed to go to admitting at [facility]   Will follow up on chronic condition after discharged from hospital

## 2013-06-27 ENCOUNTER — Inpatient Hospital Stay (HOSPITAL_COMMUNITY): Payer: Medicare Other

## 2013-06-27 ENCOUNTER — Encounter (HOSPITAL_COMMUNITY): Payer: Self-pay | Admitting: General Practice

## 2013-06-27 DIAGNOSIS — R809 Proteinuria, unspecified: Secondary | ICD-10-CM

## 2013-06-27 HISTORY — DX: Proteinuria, unspecified: R80.9

## 2013-06-27 LAB — BASIC METABOLIC PANEL
BUN: 19 mg/dL (ref 6–23)
CO2: 27 mEq/L (ref 19–32)
Calcium: 7.7 mg/dL — ABNORMAL LOW (ref 8.4–10.5)
Creatinine, Ser: 0.79 mg/dL (ref 0.50–1.10)
Glucose, Bld: 155 mg/dL — ABNORMAL HIGH (ref 70–99)
Sodium: 139 mEq/L (ref 135–145)

## 2013-06-27 LAB — PROTEIN, URINE, 24 HOUR
Collection Interval-UPROT: 24 hours
Protein, 24H Urine: 11628 mg/d — ABNORMAL HIGH (ref 50–100)
Protein, Urine: 456 mg/dL

## 2013-06-27 LAB — GLUCOSE, CAPILLARY
Glucose-Capillary: 114 mg/dL — ABNORMAL HIGH (ref 70–99)
Glucose-Capillary: 118 mg/dL — ABNORMAL HIGH (ref 70–99)
Glucose-Capillary: 136 mg/dL — ABNORMAL HIGH (ref 70–99)
Glucose-Capillary: 137 mg/dL — ABNORMAL HIGH (ref 70–99)

## 2013-06-27 LAB — TROPONIN I: Troponin I: <0.3 ng/mL (ref <0.30)

## 2013-06-27 LAB — CBC
MCH: 27.9 pg (ref 26.0–34.0)
MCHC: 33.7 g/dL (ref 30.0–36.0)
Platelets: 241 10*3/uL (ref 150–400)
WBC: 7.9 10*3/uL (ref 4.0–10.5)

## 2013-06-27 LAB — TSH: TSH: 3.17 u[IU]/mL (ref 0.350–4.500)

## 2013-06-27 LAB — PROTEIN / CREATININE RATIO, URINE
Creatinine, Urine: 55.57 mg/dL
Protein Creatinine Ratio: 8.2 — ABNORMAL HIGH (ref 0.00–0.15)

## 2013-06-27 MED ORDER — VILAZODONE HCL 40 MG PO TABS
40.0000 mg | ORAL_TABLET | Freq: Every day | ORAL | Status: DC
  Administered 2013-06-27 – 2013-06-30 (×4): 40 mg via ORAL
  Filled 2013-06-27 (×2): qty 1

## 2013-06-27 MED ORDER — VILAZODONE HCL 40 MG PO TABS: 40.0000 mg | ORAL_TABLET | Freq: Every day | ORAL | Status: DC

## 2013-06-27 MED ORDER — NICOTINE 21 MG/24HR TD PT24
21.0000 mg | MEDICATED_PATCH | Freq: Every day | TRANSDERMAL | Status: DC
  Administered 2013-06-27 – 2013-07-01 (×6): 21 mg via TRANSDERMAL
  Filled 2013-06-27 (×6): qty 1

## 2013-06-27 MED ORDER — LISINOPRIL 5 MG PO TABS
15.0000 mg | ORAL_TABLET | Freq: Every day | ORAL | Status: DC
  Administered 2013-06-28 – 2013-06-30 (×3): 15 mg via ORAL
  Filled 2013-06-27 (×4): qty 1

## 2013-06-27 MED ORDER — ATORVASTATIN CALCIUM 40 MG PO TABS
40.0000 mg | ORAL_TABLET | Freq: Every day | ORAL | Status: DC
  Administered 2013-06-27 – 2013-06-30 (×4): 40 mg via ORAL
  Filled 2013-06-27 (×5): qty 1

## 2013-06-27 MED ORDER — FUROSEMIDE 10 MG/ML IJ SOLN
40.0000 mg | Freq: Two times a day (BID) | INTRAMUSCULAR | Status: DC
  Administered 2013-06-27: 17:00:00 40 mg via INTRAVENOUS
  Filled 2013-06-27 (×3): qty 4

## 2013-06-27 NOTE — Consult Note (Signed)
[name] [name] 06/27/2013 [name] [name], B Requesting Physician:  [name] [name]  Reason for Consult:  Anasarca, proteinuria HPI: 73F with DM2, HTN, multiple other problems listed below admitted 06/26/13 from PMD with 30lb weight gain over one month; swelling in legs, thighs, face, abdomen, hands; fatigue; SOB.  Prev to this she had had edema and torsemide/spironolactone had been prescribed. She had a UA 06/01/13 with >300mg  protein, similar to this admission but previous to that they had been normal.   She notes that in the past 2 months she has had foamy, sudsy urine but no hematuria or tea colored urine.  No new arthralgias, sores in mouth/nose, new rashes, epistaxis, hemoptysis.    Workup inpt has included unremarkable 2V CXR, TTE with nl LVEF and mild Diastolic disease.  24h U collection for protein ongoing.    Pt takes BC powder on a daily basis.  No other consistent NSAID use.  Creatinine, Ser (mg/dL)  Date Value  09/81/1914 0.79   06/26/2013 0.60   06/19/2013 0.48*  05/22/2013 0.59   03/25/2013 0.61   10/31/2012 0.64   08/18/2012 0.59   11/23/2010 0.56   02/09/2010 0.8   11/18/2008 0.8   ] I/Os: I/O last 3 completed shifts: In: 600 [P.O.:600] Out: 1700 [Urine:1700]   ROS Balance of 12 systems is negative w/ exceptions as above  PMH  Past Medical History  Diagnosis Date  . Crohn's disease     hx of crohn's dz  . COPD (chronic obstructive pulmonary disease)   . Coronary artery disease   . Hypertension   . Diabetes mellitus   . Thyroid disease     hypothyroidism  . Arthritis   . Asthma   . AC (acromioclavicular) joint bone spurs     neck  . Depressed   . Nausea alone   . Unspecified gingival and periodontal disease   . Palpitations   . Allergic rhinitis due to pollen   . Methicillin resistant Staphylococcus aureus septicemia   . Acute bronchitis   . Type II or unspecified type diabetes mellitus without mention of complication, uncontrolled   . Carbuncle and furuncle of  trunk   . Abdominal pain, unspecified site   . Sacroiliitis, not elsewhere classified   . Hyperventilation   . Flaccid hemiplegia affecting dominant side   . Pain in joint, pelvic region and thigh   . Cough   . Disturbance of skin sensation   . Herpes zoster with unspecified nervous system complication   . Chest pain, unspecified   . Nonspecific abnormal electrocardiogram (ECG) (EKG)   . Candidiasis of mouth   . Hyperlipidemia   . Tobacco use disorder   . Regional enteritis of large intestine   . Osteoarthrosis, unspecified whether generalized or localized, unspecified site   . Intrinsic asthma, unspecified   . Lumbago   . Blood in stool   . Unspecified nontoxic nodular goiter   . Anxiety   . Edema   . Abdominal pain, right lower quadrant   . Dysmenorrhea   . Unspecified urinary incontinence   . Regional enteritis of unspecified site   . Type II or unspecified type diabetes mellitus without mention of complication, not stated as uncontrolled   . GERD (gastroesophageal reflux disease)   . Other malaise and fatigue   . Encounter for long-term (current) use of other medications   . Unspecified constipation   . Irritable bowel syndrome   . Diaphragmatic hernia without mention of obstruction or gangrene   . Protein  in urine 06/27/2013   PSH  Past Surgical History  Procedure Laterality Date  . Metatarsal reconstruction  1993  . Tubalization  1982  . Right foot surgery in 1993 twice    . Right foot surgery in 1995    . Colonoscopy  09/10/2006  . Tubal ligation     FH  Family History  Problem Relation Age of Onset  . Stroke Mother    SH  reports that she has been smoking Cigarettes.  She has a 30 pack-year smoking history. She has never used smokeless tobacco. She reports that she does not drink alcohol or use illicit drugs. Allergies  Allergies  Allergen Reactions  . Avelox [Moxifloxacin Hcl In Nacl] Swelling  . Chantix [Varenicline] Nausea Only  . Imipramine Hcl  Swelling  . Tetracyclines & Related Swelling   Home medications Prior to Admission medications   Medication Sig Start Date End Date Taking? Authorizing Provider  albuterol (PROVENTIL) (2.5 MG/3ML) 0.083% nebulizer solution Take 2.5 mg by nebulization every 6 (six) hours as needed for shortness of breath.    Yes Historical Provider, MD  albuterol-ipratropium (COMBIVENT) 18-103 MCG/ACT inhaler Inhale 2 puffs into the lungs every 6 (six) hours as needed for wheezing or shortness of breath.   Yes Historical Provider, MD  ALPRAZolam Prudy Feeler) 1 MG tablet Take 1 mg by mouth 3 (three) times daily as needed for anxiety.   Yes Historical Provider, MD  Aspirin-Salicylamide-Caffeine (BC HEADACHE POWDER PO) Take 1 Package by mouth every 6 (six) hours as needed (for pain).    Yes Historical Provider, MD  chlorhexidine (PERIDEX) 0.12 % solution Use as directed 15 mLs in the mouth or throat daily as needed (for thrush).    Yes Historical Provider, MD  fluconazole (DIFLUCAN) 100 MG tablet Take 100 mg by mouth daily as needed (for yeast infection).   Yes Historical Provider, MD  glipiZIDE (GLUCOTROL XL) 5 MG 24 hr tablet Take 1 tablet (5 mg total) by mouth daily. 11/07/12  Yes Claudie Revering, NP  lactulose (CHRONULAC) 10 GM/15ML solution Take 20 g by mouth daily as needed for mild constipation.   Yes Historical Provider, MD  metFORMIN (GLUCOPHAGE) 1000 MG tablet Take one tablet twice a day for diabetes 250.00 11/22/12  Yes Kimber Relic, MD  Oxycodone HCl 10 MG TABS Take one tablet up to 6 times as needed to control pain 06/19/13  Yes Tiffany L Reed, DO  promethazine (PHENERGAN) 12.5 MG tablet Take 12.5 mg by mouth every 6 (six) hours as needed for nausea or vomiting.   Yes Historical Provider, MD  spironolactone (ALDACTONE) 25 MG tablet Take 25 mg by mouth daily.   Yes Historical Provider, MD  torsemide (DEMADEX) 20 MG tablet Take 20 mg by mouth daily.   Yes Historical Provider, MD  Vilazodone HCl (VIIBRYD) 40 MG  TABS Take 40 mg by mouth daily.   Yes Historical Provider, MD  lisinopril (PRINIVIL,ZESTRIL) 10 MG tablet Take 10 mg by mouth daily.     Historical Provider, MD  simvastatin (ZOCOR) 20 MG tablet Take 20 mg by mouth every evening.     Historical Provider, MD    Current Medications Current Facility-Administered Medications  Medication Dose Route Frequency Provider Last Rate Last Dose  . 0.9 %  sodium chloride infusion  250 mL Intravenous PRN Joseph Art, DO      . albuterol (PROVENTIL) (5 MG/ML) 0.5% nebulizer solution 2.5 mg  2.5 mg Nebulization Q4H PRN Joseph Art, DO      .  ALPRAZolam (XANAX) tablet 1 mg  1 mg Oral TID PRN Joseph Art, DO      . atorvastatin (LIPITOR) tablet 40 mg  40 mg Oral q1800 Arita Miss, MD      . enoxaparin (LOVENOX) injection 40 mg  40 mg Subcutaneous Q24H Joseph Art, DO   40 mg at 06/26/13 1729  . furosemide (LASIX) injection 40 mg  40 mg Intravenous BID Arita Miss, MD      . HYDROcodone-acetaminophen (NORCO/VICODIN) 5-325 MG per tablet 1-2 tablet  1-2 tablet Oral Q4H PRN Joseph Art, DO   2 tablet at 06/26/13 2125  . insulin aspart (novoLOG) injection 0-15 Units  0-15 Units Subcutaneous TID WC Joseph Art, DO   2 Units at 06/26/13 1750  . insulin aspart (novoLOG) injection 0-5 Units  0-5 Units Subcutaneous QHS Joseph Art, DO      . [START ON 06/28/2013] lisinopril (PRINIVIL,ZESTRIL) tablet 15 mg  15 mg Oral QHS Arita Miss, MD      . nicotine (NICODERM CQ - dosed in mg/24 hours) patch 21 mg  21 mg Transdermal Daily Marinda Elk, MD   21 mg at 06/27/13 0953  . ondansetron (ZOFRAN) tablet 4 mg  4 mg Oral Q6H PRN Joseph Art, DO       Or  . ondansetron (ZOFRAN) injection 4 mg  4 mg Intravenous Q6H PRN Joseph Art, DO      . sodium chloride 0.9 % injection 3 mL  3 mL Intravenous Q12H Joseph Art, DO   3 mL at 06/27/13 0953  . sodium chloride 0.9 % injection 3 mL  3 mL Intravenous Q12H Joseph Art, DO   3 mL at  06/27/13 0953  . sodium chloride 0.9 % injection 3 mL  3 mL Intravenous PRN Joseph Art, DO      . Vilazodone HCl (VIIBRYD) TABS 40 mg  40 mg Oral Q breakfast Renaee Munda, RPH        CBC  Recent Labs Lab 06/26/13 1335 06/27/13 0107  WBC 10.8* 7.9  HGB 15.8* 12.6  HCT 44.9 37.4  MCV 82.1 82.9  PLT 285 241   Basic Metabolic Panel  Recent Labs Lab 06/26/13 1335 06/27/13 0107  NA 137 139  K 4.2 3.5  CL 106 108  CO2 23 27  GLUCOSE 93 155*  BUN 16 19  CREATININE 0.60 0.79  CALCIUM 8.3* 7.7*    Physical Exam  Blood pressure 100/63, pulse 93, temperature 97 F (36.1 C), temperature source Oral, resp. rate 18, height 5\' 1"  (1.549 m), weight 108.2 kg (238 lb 8.6 oz), SpO2 100.00%. GEN: NAD, obese ENT: NCAT, poor dentition.  No oral ulcers or nasal crusting EYES: EOMI, muddy sclera CV: RRR. No rub PULM: CTAB. No crackles. Nl wob ABD: obese, soft,nt SKIN: no rashes/lesions  EXT:4+ pitting edema into thighs.  A/P 65F presenting with the nephrotic syndrome including anasarca, proteinuria, hyperlipidemia, and hypoalbuminemia.  Possibilities include Diabetic Nephropathy, but the story she tells me is that her DM is only 5-10y in vintage.  Also the abrupt onset over the past two months is unusual for this.  Will proced with serologic workup.  Pt likely to need kidney biopsy. We discussed tenatively doing it 12/15 if inpatient.    1. Nephrotic Syndrome: HBV, HCV, ANA, dsDNA, C3, C4, HIV, RPR, SPEP, sFLCs for w/u.  Change lasix to 40 IV BID.  Placed on 2gm Na restriction.  Ordered dietary c/s for sodium restricted diet.  Gradually increase lisinopril, now 15mg  qhs.  Hold aldactone at this time.    [name] Heck MD 06/27/2013, 2:10 PM

## 2013-06-27 NOTE — Progress Notes (Signed)
[facility] PROGRESS NOTE  [name] [name] NFA:[id number] DOB: 07/13/1961 DOA: 06/26/2013 PCP: Kimber Relic, MD  Assessment/Plan: SOB due to edema- DO NOT suspect CHF vs nephrotitic syndrome- IV lasix, echo ok, CE ok  tobacco abuse- nicotine patch  DM- SSI, HgbA1C elevated  HTN- IV lasix; resumed ACE- will increase as BP tolerates  Edema- IV lasix  Proteinuria-  U/A with > 300 protein; 24 hour urine pending- spoke with nephrology for consult- ordered protein/Cr ratio  Hypothyroid-  TSH ok   Code Status: full Family Communication: patient Disposition Plan:    Consultants:  nephro  Procedures:    Antibiotics:    HPI/Subjective: Belly feels less distended Has lots of urine out after lasix  Objective: Filed Vitals:   06/27/13 0700  BP: 100/63  Pulse: 93  Temp:   Resp:     Intake/Output Summary (Last 24 hours) at 06/27/13 0849 Last data filed at 06/27/13 0641  Gross per 24 hour  Intake    600 ml  Output   1700 ml  Net  -1100 ml   Filed Weights   06/26/13 1216 06/27/13 0546  Weight: 107.6 kg (237 lb 3.4 oz) 108.2 kg (238 lb 8.6 oz)    Exam:   General:  A+Ox3, NAD  Cardiovascular: rrr  Respiratory: clear, no wheezing  Abdomen: +BS, soft, distended- less than yesterday  Musculoskeletal: moves all 4 ext, +ptting edema in b/L legs   Data Reviewed: Basic Metabolic Panel:  Recent Labs Lab 06/26/13 1335 06/27/13 0107  NA 137 139  K 4.2 3.5  CL 106 108  CO2 23 27  GLUCOSE 93 155*  BUN 16 19  CREATININE 0.60 0.79  CALCIUM 8.3* 7.7*   Liver Function Tests:  Recent Labs Lab 06/26/13 1335  AST 16  ALT 9  ALKPHOS 91  BILITOT <0.1*  PROT 5.3*  ALBUMIN 0.8*   No results found for this basename: LIPASE, AMYLASE,  in the last 168 hours No results found for this basename: AMMONIA,  in the last 168 hours CBC:  Recent Labs Lab 06/26/13 1335 06/27/13 0107  WBC 10.8* 7.9  HGB 15.8* 12.6  HCT 44.9 37.4  MCV 82.1 82.9   PLT 285 241   Cardiac Enzymes:  Recent Labs Lab 06/26/13 1335 06/26/13 1910 06/27/13 0107  TROPONINI <0.30 <0.30 <0.30   BNP (last 3 results)  Recent Labs  05/22/13 1811 06/26/13 1335  PROBNP 22.2 52.0   CBG:  Recent Labs Lab 06/26/13 1727 06/26/13 2115 06/27/13 0555  GLUCAP 132* 168* 114*    No results found for this or any previous visit (from the past 240 hour(s)).   Studies: Dg Chest 2 View  06/26/2013   CLINICAL DATA:  History of pneumonia, shortness of breath.  EXAM: CHEST  2 VIEW  COMPARISON:  None.  FINDINGS: Low lung volumes. The heart size and mediastinal contours are within normal limits. Both lungs are clear. The visualized skeletal structures are unremarkable.  IMPRESSION: No active cardiopulmonary disease.   Electronically Signed   By: Salome Holmes M.D.   On: 06/26/2013 16:46    Scheduled Meds: . enoxaparin (LOVENOX) injection  40 mg Subcutaneous Q24H  . furosemide  40 mg Intravenous Daily  . insulin aspart  0-15 Units Subcutaneous TID WC  . insulin aspart  0-5 Units Subcutaneous QHS  . lisinopril  10 mg Oral Daily  . nicotine  21 mg Transdermal Daily  . sodium chloride  3 mL Intravenous Q12H  . sodium chloride  3 mL Intravenous Q12H  . Vilazodone HCl  40 mg Oral Daily   Continuous Infusions:   Active Problems:   Hypertension   Type II or unspecified type diabetes mellitus with neurological manifestations, uncontrolled(250.62)   Edema   SOB (shortness of breath)   Tobacco abuse    Time spent:    [facility], Akira Perusse  [facility] Pager [phone]. If 7PM-7AM, please contact night-coverage at [email or web address], password [facility] 06/27/2013, 8:49 AM  LOS: 1 day

## 2013-06-27 NOTE — Plan of Care (Signed)
Problem: Food- and Nutrition-Related Knowledge Deficit (NB-1.1) Goal: Nutrition education Formal process to instruct or train a patient/client in a skill or to impart knowledge to help patients/clients voluntarily manage or modify food choices and eating behavior to maintain or improve health. Outcome: Completed/Met Date Met:  06/27/13  Nutrition Education Note  RD consulted for nutrition education regarding need for Low Sodium diet education.  RD provided "Low Sodium Nutrition Therapy" handout from the Academy of [employer]. Reviewed patient's dietary recall. Provided examples on ways to decrease sodium intake in diet. Discouraged intake of processed foods and use of salt shaker. Encouraged fresh fruits and vegetables as well as whole grain sources of carbohydrates to maximize fiber intake.   RD discussed why it is important for patient to adhere to diet recommendations, and emphasized the role of fluids, foods to avoid, and importance of weighing self daily. Teach back method used.  Expect fair compliance.  Body mass index is 45.09 kg/(m^2). Pt meets criteria for Obese Class III based on current BMI.  Current diet order is Carbohydrate Modified Medium, patient is consuming approximately 100% of meals at this time. Labs and medications reviewed. No further nutrition interventions warranted at this time. RD contact information provided. If additional nutrition issues arise, please re-consult RD.   Jarold Motto MS, RD, LDN Pager: [phone] After-hours pager: [phone]

## 2013-06-28 ENCOUNTER — Encounter (HOSPITAL_COMMUNITY): Payer: Self-pay | Admitting: Radiology

## 2013-06-28 DIAGNOSIS — N049 Nephrotic syndrome with unspecified morphologic changes: Principal | ICD-10-CM

## 2013-06-28 LAB — BASIC METABOLIC PANEL
BUN: 18 mg/dL (ref 6–23)
CO2: 25 mEq/L (ref 19–32)
Calcium: 8.3 mg/dL — ABNORMAL LOW (ref 8.4–10.5)
Creatinine, Ser: 0.74 mg/dL (ref 0.50–1.10)
GFR calc Af Amer: >90 mL/min (ref >90)
GFR calc non Af Amer: >90 mL/min (ref >90)
Glucose, Bld: 140 mg/dL — ABNORMAL HIGH (ref 70–99)
Sodium: 138 mEq/L (ref 135–145)

## 2013-06-28 LAB — GLUCOSE, CAPILLARY
Glucose-Capillary: 132 mg/dL — ABNORMAL HIGH (ref 70–99)
Glucose-Capillary: 140 mg/dL — ABNORMAL HIGH (ref 70–99)
Glucose-Capillary: 97 mg/dL (ref 70–99)
Glucose-Capillary: 131 mg/dL — ABNORMAL HIGH (ref 70–99)

## 2013-06-28 LAB — CBC
HCT: 39.1 % (ref 36.0–46.0)
Hemoglobin: 13.2 g/dL (ref 12.0–15.0)
MCH: 28 pg (ref 26.0–34.0)
MCHC: 33.8 g/dL (ref 30.0–36.0)
MCV: 82.8 fL (ref 78.0–100.0)
RBC: 4.72 MIL/uL (ref 3.87–5.11)

## 2013-06-28 LAB — HIV ANTIBODY (ROUTINE TESTING W REFLEX): HIV: NONREACTIVE

## 2013-06-28 LAB — HEPATITIS B SURFACE ANTIGEN: Hepatitis B Surface Ag: NEGATIVE

## 2013-06-28 MED ORDER — HYDROCORTISONE 2.5 % RE CREA
TOPICAL_CREAM | Freq: Two times a day (BID) | RECTAL | Status: DC
  Administered 2013-06-28 – 2013-06-30 (×5): via RECTAL
  Filled 2013-06-28: qty 28.35

## 2013-06-28 MED ORDER — ALUM & MAG HYDROXIDE-SIMETH 200-200-20 MG/5ML PO SUSP
15.0000 mL | Freq: Four times a day (QID) | ORAL | Status: DC | PRN
  Filled 2013-06-28: qty 30

## 2013-06-28 MED ORDER — FUROSEMIDE 10 MG/ML IJ SOLN
60.0000 mg | Freq: Two times a day (BID) | INTRAMUSCULAR | Status: DC
  Administered 2013-06-28 – 2013-07-01 (×7): 60 mg via INTRAVENOUS
  Filled 2013-06-28 (×6): qty 6

## 2013-06-28 MED ORDER — ENOXAPARIN SODIUM 40 MG/0.4ML ~~LOC~~ SOLN
40.0000 mg | Freq: Once | SUBCUTANEOUS | Status: AC
  Administered 2013-06-28: 40 mg via SUBCUTANEOUS
  Filled 2013-06-28: qty 0.4

## 2013-06-28 MED ORDER — FUROSEMIDE 10 MG/ML IJ SOLN
INTRAMUSCULAR | Status: AC
  Filled 2013-06-28: qty 8

## 2013-06-28 MED ORDER — ENOXAPARIN SODIUM 40 MG/0.4ML ~~LOC~~ SOLN
40.0000 mg | Freq: Once | SUBCUTANEOUS | Status: DC
  Filled 2013-06-28: qty 0.4

## 2013-06-28 NOTE — H&P (Signed)
Chief Complaint: "Weight gain, swelling and shortness of breath." Referring Physician: Dr. Marisue Humble HPI: [name] [name] is an 52 y.o. female who presented after seeing her PCP on 12/11 and c/o shortness of breath, intermittent sharp chest pains lasting for seconds, 30 lb weight gain in 1 month, lower extremity swelling and abdominal pain with decreased urine output. The patient found to have proteinuria >300 and hyaline casts as well as 24 Hr urine protein elevated. Nephrology has seen the patient and requested an image guided renal biopsy on 12/15. The patient states her shortness of breath is improving today after IV lasix and she has been ambulating the halls often. She still c/o significant lower extremity swelling and would like to proceed with the biopsy. She denies any hematuria. She denies any complications in the past with sedation during a colonoscopy. She is unsure if she has sleep apnea as she previously was told to undergo a sleep study but was unable to at that time, she does not currently use oxygen or cpap at night. She does admit to asthma and uses nebulizer treatments sometimes.   Past Medical History:  Past Medical History  Diagnosis Date  . Crohn's disease     hx of crohn's dz  . COPD (chronic obstructive pulmonary disease)   . Coronary artery disease   . Hypertension   . Diabetes mellitus   . Thyroid disease     hypothyroidism  . Arthritis   . Asthma   . AC (acromioclavicular) joint bone spurs     neck  . Depressed   . Nausea alone   . Unspecified gingival and periodontal disease   . Palpitations   . Allergic rhinitis due to pollen   . Methicillin resistant Staphylococcus aureus septicemia   . Acute bronchitis   . Type II or unspecified type diabetes mellitus without mention of complication, uncontrolled   . Carbuncle and furuncle of trunk   . Abdominal pain, unspecified site   . Sacroiliitis, not elsewhere classified   . Hyperventilation   . Flaccid hemiplegia  affecting dominant side   . Pain in joint, pelvic region and thigh   . Cough   . Disturbance of skin sensation   . Herpes zoster with unspecified nervous system complication   . Chest pain, unspecified   . Nonspecific abnormal electrocardiogram (ECG) (EKG)   . Candidiasis of mouth   . Hyperlipidemia   . Tobacco use disorder   . Regional enteritis of large intestine   . Osteoarthrosis, unspecified whether generalized or localized, unspecified site   . Intrinsic asthma, unspecified   . Lumbago   . Blood in stool   . Unspecified nontoxic nodular goiter   . Anxiety   . Edema   . Abdominal pain, right lower quadrant   . Dysmenorrhea   . Unspecified urinary incontinence   . Regional enteritis of unspecified site   . Type II or unspecified type diabetes mellitus without mention of complication, not stated as uncontrolled   . GERD (gastroesophageal reflux disease)   . Other malaise and fatigue   . Encounter for long-term (current) use of other medications   . Unspecified constipation   . Irritable bowel syndrome   . Diaphragmatic hernia without mention of obstruction or gangrene   . Protein in urine 06/27/2013    Past Surgical History:  Past Surgical History  Procedure Laterality Date  . Metatarsal reconstruction  1993  . Tubalization  1982  . Right foot surgery in 1993 twice    .  Right foot surgery in 1995    . Colonoscopy  09/10/2006  . Tubal ligation      Family History:  Family History  Problem Relation Age of Onset  . Stroke Mother     Social History:  reports that she has been smoking Cigarettes.  She has a 30 pack-year smoking history. She has never used smokeless tobacco. She reports that she does not drink alcohol or use illicit drugs.  Allergies:  Allergies  Allergen Reactions  . Avelox [Moxifloxacin Hcl In Nacl] Swelling  . Chantix [Varenicline] Nausea Only  . Imipramine Hcl Swelling  . Tetracyclines & Related Swelling      Medication List    ASK your  doctor about these medications       albuterol (2.5 MG/3ML) 0.083% nebulizer solution  Commonly known as:  PROVENTIL  Take 2.5 mg by nebulization every 6 (six) hours as needed for shortness of breath.     albuterol-ipratropium 18-103 MCG/ACT inhaler  Commonly known as:  COMBIVENT  Inhale 2 puffs into the lungs every 6 (six) hours as needed for wheezing or shortness of breath.     ALPRAZolam 1 MG tablet  Commonly known as:  XANAX  Take 1 mg by mouth 3 (three) times daily as needed for anxiety.     BC HEADACHE POWDER PO  Take 1 Package by mouth every 6 (six) hours as needed (for pain).     chlorhexidine 0.12 % solution  Commonly known as:  PERIDEX  Use as directed 15 mLs in the mouth or throat daily as needed (for thrush).     fluconazole 100 MG tablet  Commonly known as:  DIFLUCAN  Take 100 mg by mouth daily as needed (for yeast infection).     glipiZIDE 5 MG 24 hr tablet  Commonly known as:  GLUCOTROL XL  Take 1 tablet (5 mg total) by mouth daily.     lactulose 10 GM/15ML solution  Commonly known as:  CHRONULAC  Take 20 g by mouth daily as needed for mild constipation.     lisinopril 10 MG tablet  Commonly known as:  PRINIVIL,ZESTRIL  Take 10 mg by mouth daily.     metFORMIN 1000 MG tablet  Commonly known as:  GLUCOPHAGE  Take one tablet twice a day for diabetes 250.00     Oxycodone HCl 10 MG Tabs  Take one tablet up to 6 times as needed to control pain     promethazine 12.5 MG tablet  Commonly known as:  PHENERGAN  Take 12.5 mg by mouth every 6 (six) hours as needed for nausea or vomiting.     simvastatin 20 MG tablet  Commonly known as:  ZOCOR  Take 20 mg by mouth every evening.     spironolactone 25 MG tablet  Commonly known as:  ALDACTONE  Take 25 mg by mouth daily.     torsemide 20 MG tablet  Commonly known as:  DEMADEX  Take 20 mg by mouth daily.     VIIBRYD 40 MG Tabs  Generic drug:  Vilazodone HCl  Take 40 mg by mouth daily.       Please HPI  for pertinent positives, otherwise complete 10 system ROS negative.  Physical Exam: BP 154/62  Pulse 95  Temp(Src) 98.2 F (36.8 C) (Oral)  Resp 18  Ht 5\' 1"  (1.549 m)  Wt 236 lb (107.049 kg)  BMI 44.61 kg/m2  SpO2 97% Body mass index is 44.61 kg/(m^2).  General Appearance:  Alert, cooperative,  no distress  Head:  Normocephalic, without obvious abnormality, atraumatic  Neck: Supple, symmetrical, trachea midline  Lungs:   Clear to auscultation bilaterally, no w/r/r, respirations unlabored without use of accessory muscles.  Chest Wall:  No tenderness or deformity  Heart:  Regular rate and rhythm, S1, S2 normal, no murmur, rub or gallop.  Abdomen:   Soft, non-tender, non distended, (+) BS  Extremities: Extremities 3+ pitting edema bilaterally, atraumatic, no cyanosis  Neurologic: Normal affect, no gross deficits.   Results for orders placed during the hospital encounter of 06/26/13 (from the past 48 hour(s))  CBC     Status: Abnormal   Collection Time    06/26/13  1:35 PM      Result Value Range   WBC 10.8 (*) 4.0 - 10.5 K/uL   RBC 5.47 (*) 3.87 - 5.11 MIL/uL   Hemoglobin 15.8 (*) 12.0 - 15.0 g/dL   HCT 16.1  09.6 - 04.5 %   MCV 82.1  78.0 - 100.0 fL   MCH 28.9  26.0 - 34.0 pg   MCHC 35.2  30.0 - 36.0 g/dL   RDW 40.9  81.1 - 91.4 %   Platelets 285  150 - 400 K/uL  COMPREHENSIVE METABOLIC PANEL     Status: Abnormal   Collection Time    06/26/13  1:35 PM      Result Value Range   Sodium 137  135 - 145 mEq/L   Potassium 4.2  3.5 - 5.1 mEq/L   Chloride 106  96 - 112 mEq/L   CO2 23  19 - 32 mEq/L   Glucose, Bld 93  70 - 99 mg/dL   BUN 16  6 - 23 mg/dL   Creatinine, Ser 7.82  0.50 - 1.10 mg/dL   Calcium 8.3 (*) 8.4 - 10.5 mg/dL   Total Protein 5.3 (*) 6.0 - 8.3 g/dL   Albumin 0.8 (*) 3.5 - 5.2 g/dL   AST 16  0 - 37 U/L   ALT 9  0 - 35 U/L   Alkaline Phosphatase 91  39 - 117 U/L   Total Bilirubin <0.1 (*) 0.3 - 1.2 mg/dL   GFR calc non Af Amer >90  >90 mL/min   GFR calc  Af Amer >90  >90 mL/min   Comment: (NOTE)     The eGFR has been calculated using the CKD EPI equation.     This calculation has not been validated in all clinical situations.     eGFR's persistently <90 mL/min signify possible Chronic Kidney     Disease.  PRO B NATRIURETIC PEPTIDE     Status: None   Collection Time    06/26/13  1:35 PM      Result Value Range   Pro B Natriuretic peptide (BNP) 52.0  0 - 125 pg/mL  TROPONIN I     Status: None   Collection Time    06/26/13  1:35 PM      Result Value Range   Troponin I <0.30  <0.30 ng/mL   Comment:            Due to the release kinetics of cTnI,     a negative result within the first hours     of the onset of symptoms does not rule out     myocardial infarction with certainty.     If myocardial infarction is still suspected,     repeat the test at appropriate intervals.  URINE RAPID DRUG SCREEN (HOSP PERFORMED)  Status: None   Collection Time    06/26/13  2:34 PM      Result Value Range   Opiates NONE DETECTED  NONE DETECTED   Cocaine NONE DETECTED  NONE DETECTED   Benzodiazepines NONE DETECTED  NONE DETECTED   Amphetamines NONE DETECTED  NONE DETECTED   Tetrahydrocannabinol NONE DETECTED  NONE DETECTED   Barbiturates NONE DETECTED  NONE DETECTED   Comment:            DRUG SCREEN FOR MEDICAL PURPOSES     ONLY.  IF CONFIRMATION IS NEEDED     FOR ANY PURPOSE, NOTIFY LAB     WITHIN 5 DAYS.                LOWEST DETECTABLE LIMITS     FOR URINE DRUG SCREEN     Drug Class       Cutoff (ng/mL)     Amphetamine      1000     Barbiturate      200     Benzodiazepine   200     Tricyclics       300     Opiates          300     Cocaine          300     THC              50  URINALYSIS, ROUTINE W REFLEX MICROSCOPIC     Status: Abnormal   Collection Time    06/26/13  2:34 PM      Result Value Range   Color, Urine YELLOW  YELLOW   APPearance HAZY (*) CLEAR   Specific Gravity, Urine 1.033 (*) 1.005 - 1.030   pH 5.5  5.0 - 8.0    Glucose, UA NEGATIVE  NEGATIVE mg/dL   Hgb urine dipstick MODERATE (*) NEGATIVE   Bilirubin Urine NEGATIVE  NEGATIVE   Ketones, ur NEGATIVE  NEGATIVE mg/dL   Protein, ur >540 (*) NEGATIVE mg/dL   Urobilinogen, UA 0.2  0.0 - 1.0 mg/dL   Nitrite NEGATIVE  NEGATIVE   Leukocytes, UA NEGATIVE  NEGATIVE  URINE MICROSCOPIC-ADD ON     Status: Abnormal   Collection Time    06/26/13  2:34 PM      Result Value Range   Squamous Epithelial / LPF MANY (*) RARE   WBC, UA 0-2  <3 WBC/hpf   Bacteria, UA FEW (*) RARE   Casts HYALINE CASTS (*) NEGATIVE  GLUCOSE, CAPILLARY     Status: Abnormal   Collection Time    06/26/13  5:27 PM      Result Value Range   Glucose-Capillary 132 (*) 70 - 99 mg/dL  PROTEIN, URINE, 24 HOUR     Status: Abnormal   Collection Time    06/26/13  5:45 PM      Result Value Range   Urine Total Volume-UPROT 2550     Collection Interval-UPROT 24     Protein, Urine 456     Protein, 24H Urine 98119 (*) 50 - 100 mg/day  PROTEIN / CREATININE RATIO, URINE     Status: Abnormal   Collection Time    06/26/13  5:45 PM      Result Value Range   Creatinine, Urine 55.57     Total Protein, Urine 455.5     Comment: NO NORMAL RANGE ESTABLISHED FOR THIS TEST   PROTEIN CREATININE RATIO 8.20 (*) 0.00 - 0.15  TROPONIN I  Status: None   Collection Time    06/26/13  7:10 PM      Result Value Range   Troponin I <0.30  <0.30 ng/mL   Comment:            Due to the release kinetics of cTnI,     a negative result within the first hours     of the onset of symptoms does not rule out     myocardial infarction with certainty.     If myocardial infarction is still suspected,     repeat the test at appropriate intervals.  TSH     Status: None   Collection Time    06/26/13  7:10 PM      Result Value Range   TSH 3.170  0.350 - 4.500 uIU/mL   Comment: Performed at [facility]  GLUCOSE, CAPILLARY     Status: Abnormal   Collection Time    06/26/13  9:15 PM      Result Value Range    Glucose-Capillary 168 (*) 70 - 99 mg/dL   Comment 1 Notify RN     Comment 2 Documented in Chart    TROPONIN I     Status: None   Collection Time    06/27/13  1:07 AM      Result Value Range   Troponin I <0.30  <0.30 ng/mL   Comment:            Due to the release kinetics of cTnI,     a negative result within the first hours     of the onset of symptoms does not rule out     myocardial infarction with certainty.     If myocardial infarction is still suspected,     repeat the test at appropriate intervals.  CBC     Status: None   Collection Time    06/27/13  1:07 AM      Result Value Range   WBC 7.9  4.0 - 10.5 K/uL   RBC 4.51  3.87 - 5.11 MIL/uL   Hemoglobin 12.6  12.0 - 15.0 g/dL   Comment: REPEATED TO VERIFY     DELTA CHECK NOTED   HCT 37.4  36.0 - 46.0 %   MCV 82.9  78.0 - 100.0 fL   MCH 27.9  26.0 - 34.0 pg   MCHC 33.7  30.0 - 36.0 g/dL   RDW 16.1  09.6 - 04.5 %   Platelets 241  150 - 400 K/uL  BASIC METABOLIC PANEL     Status: Abnormal   Collection Time    06/27/13  1:07 AM      Result Value Range   Sodium 139  135 - 145 mEq/L   Potassium 3.5  3.5 - 5.1 mEq/L   Chloride 108  96 - 112 mEq/L   CO2 27  19 - 32 mEq/L   Glucose, Bld 155 (*) 70 - 99 mg/dL   BUN 19  6 - 23 mg/dL   Creatinine, Ser 4.09  0.50 - 1.10 mg/dL   Calcium 7.7 (*) 8.4 - 10.5 mg/dL   GFR calc non Af Amer >90  >90 mL/min   GFR calc Af Amer >90  >90 mL/min   Comment: (NOTE)     The eGFR has been calculated using the CKD EPI equation.     This calculation has not been validated in all clinical situations.     eGFR's persistently <90 mL/min signify possible Chronic Kidney  Disease.  GLUCOSE, CAPILLARY     Status: Abnormal   Collection Time    06/27/13  5:55 AM      Result Value Range   Glucose-Capillary 114 (*) 70 - 99 mg/dL  GLUCOSE, CAPILLARY     Status: Abnormal   Collection Time    06/27/13 11:16 AM      Result Value Range   Glucose-Capillary 118 (*) 70 - 99 mg/dL   Comment 1 Notify  RN    HEPATITIS C ANTIBODY     Status: None   Collection Time    06/27/13  3:30 PM      Result Value Range   HCV Ab NEGATIVE  NEGATIVE   Comment: Performed at [facility]  HEPATITIS B SURFACE ANTIGEN     Status: None   Collection Time    06/27/13  3:30 PM      Result Value Range   Hepatitis B Surface Ag NEGATIVE  NEGATIVE   Comment: Performed at [facility]  HIV ANTIBODY (ROUTINE TESTING)     Status: None   Collection Time    06/27/13  3:30 PM      Result Value Range   HIV NON REACTIVE  NON REACTIVE   Comment: Performed at [facility]  C3 COMPLEMENT     Status: Abnormal   Collection Time    06/27/13  3:30 PM      Result Value Range   C3 Complement 216 (*) 90 - 180 mg/dL   Comment: Performed at [facility]  C4 COMPLEMENT     Status: Abnormal   Collection Time    06/27/13  3:30 PM      Result Value Range   Complement C4, Body Fluid 55 (*) 10 - 40 mg/dL   Comment: Performed at [facility]  RPR     Status: None   Collection Time    06/27/13  3:30 PM      Result Value Range   RPR NON REACTIVE  NON REACTIVE   Comment: Performed at [facility]  GLUCOSE, CAPILLARY     Status: Abnormal   Collection Time    06/27/13  5:28 PM      Result Value Range   Glucose-Capillary 136 (*) 70 - 99 mg/dL   Comment 1 Documented in Chart    GLUCOSE, CAPILLARY     Status: Abnormal   Collection Time    06/27/13  9:05 PM      Result Value Range   Glucose-Capillary 137 (*) 70 - 99 mg/dL  CBC     Status: None   Collection Time    06/28/13  3:48 AM      Result Value Range   WBC 7.6  4.0 - 10.5 K/uL   RBC 4.72  3.87 - 5.11 MIL/uL   Hemoglobin 13.2  12.0 - 15.0 g/dL   HCT 16.1  09.6 - 04.5 %   MCV 82.8  78.0 - 100.0 fL   MCH 28.0  26.0 - 34.0 pg   MCHC 33.8  30.0 - 36.0 g/dL   RDW 40.9  81.1 - 91.4 %   Platelets 242  150 - 400 K/uL  BASIC METABOLIC PANEL     Status: Abnormal   Collection Time    06/28/13  3:48 AM      Result Value  Range   Sodium 138  135 - 145 mEq/L   Potassium 4.8  3.5 - 5.1 mEq/L   Comment: DELTA  CHECK NOTED   Chloride 107  96 - 112 mEq/L   CO2 25  19 - 32 mEq/L   Glucose, Bld 140 (*) 70 - 99 mg/dL   BUN 18  6 - 23 mg/dL   Creatinine, Ser 1.61  0.50 - 1.10 mg/dL   Calcium 8.3 (*) 8.4 - 10.5 mg/dL   GFR calc non Af Amer >90  >90 mL/min   GFR calc Af Amer >90  >90 mL/min   Comment: (NOTE)     The eGFR has been calculated using the CKD EPI equation.     This calculation has not been validated in all clinical situations.     eGFR's persistently <90 mL/min signify possible Chronic Kidney     Disease.  GLUCOSE, CAPILLARY     Status: Abnormal   Collection Time    06/28/13  6:37 AM      Result Value Range   Glucose-Capillary 132 (*) 70 - 99 mg/dL   Dg Chest 2 View  09/60/4540   CLINICAL DATA:  History of pneumonia, shortness of breath.  EXAM: CHEST  2 VIEW  COMPARISON:  None.  FINDINGS: Low lung volumes. The heart size and mediastinal contours are within normal limits. Both lungs are clear. The visualized skeletal structures are unremarkable.  IMPRESSION: No active cardiopulmonary disease.   Electronically Signed   By: Salome Holmes M.D.   On: 06/26/2013 16:46   US Renal  06/27/2013   CLINICAL DATA:  Nephrotic syndrome  EXAM: RENAL/URINARY TRACT ULTRASOUND COMPLETE  COMPARISON:  11/23/2010  FINDINGS: Right Kidney:  Length: 13.7 cm. Echogenicity within normal limits. No mass or hydronephrosis visualized.  Left Kidney:  Length: 12.7 cm. Echogenicity within normal limits. No mass or hydronephrosis visualized.  Bladder:  Partially distended. Some indentation upon the bladder is noted likely related to the uterus no definitive filling defect is seen.  IMPRESSION: No acute abnormality noted.   Electronically Signed   By: Alcide Clever M.D.   On: 06/27/2013 16:56    Assessment/Plan Nephrotic syndrome  Anasarca Request for image guided renal biopsy Patient will be NPO for procedure 12/15, blood thinners  held. Risks and Benefits discussed with the patient. All of the patient's questions were answered, patient is agreeable to proceed. Consent signed and in chart.   Pattricia Boss D PA-C 06/28/2013, 10:48 AM

## 2013-06-28 NOTE — Progress Notes (Signed)
Admit: 06/26/2013 LOS: 2  18F w/ DM, HTN, admitted with nephrotic syndrome including weight gain, anasarca, hyperlipidemia, hypoalbuminemia. W/u includes neg HIV, HBV, HCV; nl C3, C4.  Others pending.   Subjective:  Feels beeter this am, "loosening up"  But still tight in legs.  NO early satiety Walking in halls 24h U Protein 11.6 UP/C 8.2 HIV, RPR, HBV, HCV neg C3 and C4 normal Met with dietician re Low Na diet Started lipitor for inc cholesterol  12/12 0701 - 12/13 0700 In: 1000 [P.O.:1000] Out: 1505 [Urine:1500; Stool:5]  Filed Weights   06/26/13 1216 06/27/13 0546 06/28/13 0517  Weight: 107.6 kg (237 lb 3.4 oz) 108.2 kg (238 lb 8.6 oz) 107.049 kg (236 lb)    Current meds: reviewed including lovenox 40mg  daliy. Lasix 40 IV BID, lisinopril 15 qhs Current Labs: reviewed    Physical Exam:  Blood pressure 154/62, pulse 95, temperature 98.2 F (36.8 C), temperature source Oral, resp. rate 18, height 5\' 1"  (1.549 m), weight 107.049 kg (236 lb), SpO2 97.00%. GEN: NAD, obese  ENT: NCAT, poor dentition. No oral ulcers or nasal crusting  EYES: EOMI, muddy sclera  CV: RRR. No rub  PULM: CTAB. No crackles. Nl wob  ABD: obese, soft,nt  SKIN: no rashes/lesions  EXT:4+ pitting edema into thighs.   Assessment/Plan 1. Nephrotic Syndrome. (24h U Protein 11.6 and UP/C 8.2)  Though this could be diabetic nephropathy, it is not convincing.  Serologies, C3, C4 to this point negative.  Others remain outstanding.  I favor staying for diuresis and renal biopsy on Monday as does pt.  WIll need to hold LMWH after today.  COnt lisinopril 15mg  daily 2. Anasarca / Edema: Inc lasix to 60IV BID.  Na restricted diet 3. Hyperlipidemia: started atorvastatin 40 on 12/12  [name] MD 06/28/2013, 7:59 AM   Recent Labs Lab 06/26/13 1335 06/27/13 0107 06/28/13 0348  NA 137 139 138  K 4.2 3.5 4.8  CL 106 108 107  CO2 23 27 25   GLUCOSE 93 155* 140*  BUN 16 19 18   CREATININE 0.60 0.79 0.74   CALCIUM 8.3* 7.7* 8.3*    Recent Labs Lab 06/26/13 1335 06/27/13 0107 06/28/13 0348  WBC 10.8* 7.9 7.6  HGB 15.8* 12.6 13.2  HCT 44.9 37.4 39.1  MCV 82.1 82.9 82.8  PLT 285 241 242

## 2013-06-28 NOTE — Progress Notes (Signed)
[facility] PROGRESS NOTE  [name] [name] ZOX:[id number] DOB: 11/06/1960 DOA: 06/26/2013 PCP: Kimber Relic, MD  Assessment/Plan: SOB due to edema- improved         Nephrotic syndrome: appreciate renal: HBV negative, HCV negative, ANA, dsDNA, C3 elevated, C4 elevated, HIV   negative, RPR negative, SPEP, sFLCs for w/u.  Change lasix to 40 IV BID. Gradually increase lisinopril, now  15mg  qhs. ?renal biopsy on Monday  tobacco abuse- nicotine patch  DM- SSI, HgbA1C elevated  HTN- IV lasix; resumed ACE- will increase as BP tolerates  Edema- IV lasix  Proteinuria-  From nephrotic syndrome  Hypothyroid-  TSH ok   Code Status: full Family Communication: patient Disposition Plan:    Consultants:  nephro  Procedures:    Antibiotics:    HPI/Subjective: Bowels moving better Urinating a lot Stomach feels less distended  Objective: Filed Vitals:   06/28/13 0517  BP: 154/62  Pulse: 95  Temp: 98.2 F (36.8 C)  Resp: 18    Intake/Output Summary (Last 24 hours) at 06/28/13 0759 Last data filed at 06/28/13 0643  Gross per 24 hour  Intake   1000 ml  Output   1505 ml  Net   -505 ml   Filed Weights   06/26/13 1216 06/27/13 0546 06/28/13 0517  Weight: 107.6 kg (237 lb 3.4 oz) 108.2 kg (238 lb 8.6 oz) 107.049 kg (236 lb)    Exam:   General:  A+Ox3, NAD  Cardiovascular: rrr  Respiratory: clear, no wheezing  Abdomen: +BS, soft, distended- less than yesterday  Musculoskeletal: moves all 4 ext, +ptting edema in b/L legs   Data Reviewed: Basic Metabolic Panel:  Recent Labs Lab 06/26/13 1335 06/27/13 0107 06/28/13 0348  NA 137 139 138  K 4.2 3.5 4.8  CL 106 108 107  CO2 23 27 25   GLUCOSE 93 155* 140*  BUN 16 19 18   CREATININE 0.60 0.79 0.74  CALCIUM 8.3* 7.7* 8.3*   Liver Function Tests:  Recent Labs Lab 06/26/13 1335  AST 16  ALT 9  ALKPHOS 91  BILITOT <0.1*  PROT 5.3*  ALBUMIN 0.8*   No results found for this basename:  LIPASE, AMYLASE,  in the last 168 hours No results found for this basename: AMMONIA,  in the last 168 hours CBC:  Recent Labs Lab 06/26/13 1335 06/27/13 0107 06/28/13 0348  WBC 10.8* 7.9 7.6  HGB 15.8* 12.6 13.2  HCT 44.9 37.4 39.1  MCV 82.1 82.9 82.8  PLT 285 241 242   Cardiac Enzymes:  Recent Labs Lab 06/26/13 1335 06/26/13 1910 06/27/13 0107  TROPONINI <0.30 <0.30 <0.30   BNP (last 3 results)  Recent Labs  05/22/13 1811 06/26/13 1335  PROBNP 22.2 52.0   CBG:  Recent Labs Lab 06/27/13 0555 06/27/13 1116 06/27/13 1728 06/27/13 2105 06/28/13 0637  GLUCAP 114* 118* 136* 137* 132*    No results found for this or any previous visit (from the past 240 hour(s)).   Studies: Dg Chest 2 View  06/26/2013   CLINICAL DATA:  History of pneumonia, shortness of breath.  EXAM: CHEST  2 VIEW  COMPARISON:  None.  FINDINGS: Low lung volumes. The heart size and mediastinal contours are within normal limits. Both lungs are clear. The visualized skeletal structures are unremarkable.  IMPRESSION: No active cardiopulmonary disease.   Electronically Signed   By: Salome Holmes M.D.   On: 06/26/2013 16:46   US Renal  06/27/2013   CLINICAL DATA:  Nephrotic syndrome  EXAM: RENAL/URINARY  TRACT ULTRASOUND COMPLETE  COMPARISON:  11/23/2010  FINDINGS: Right Kidney:  Length: 13.7 cm. Echogenicity within normal limits. No mass or hydronephrosis visualized.  Left Kidney:  Length: 12.7 cm. Echogenicity within normal limits. No mass or hydronephrosis visualized.  Bladder:  Partially distended. Some indentation upon the bladder is noted likely related to the uterus no definitive filling defect is seen.  IMPRESSION: No acute abnormality noted.   Electronically Signed   By: Alcide Clever M.D.   On: 06/27/2013 16:56    Scheduled Meds: . atorvastatin  40 mg Oral q1800  . enoxaparin (LOVENOX) injection  40 mg Subcutaneous Q24H  . furosemide  40 mg Intravenous BID  . insulin aspart  0-15 Units  Subcutaneous TID WC  . insulin aspart  0-5 Units Subcutaneous QHS  . lisinopril  15 mg Oral QHS  . nicotine  21 mg Transdermal Daily  . sodium chloride  3 mL Intravenous Q12H  . sodium chloride  3 mL Intravenous Q12H  . Vilazodone HCl  40 mg Oral Q breakfast   Continuous Infusions:   Active Problems:   Hypertension   Type II or unspecified type diabetes mellitus with neurological manifestations, uncontrolled(250.62)   Edema   SOB (shortness of breath)   Tobacco abuse    Time spent:    Marlin Canary  [facility] Pager [phone]. If 7PM-7AM, please contact night-coverage at [email or web address], password [facility] 06/28/2013, 7:59 AM  LOS: 2 days

## 2013-06-29 LAB — GLUCOSE, CAPILLARY
Glucose-Capillary: 134 mg/dL — ABNORMAL HIGH (ref 70–99)
Glucose-Capillary: 159 mg/dL — ABNORMAL HIGH (ref 70–99)
Glucose-Capillary: 164 mg/dL — ABNORMAL HIGH (ref 70–99)
Glucose-Capillary: 158 mg/dL — ABNORMAL HIGH (ref 70–99)

## 2013-06-29 LAB — RENAL FUNCTION PANEL
Albumin: 1.2 g/dL — ABNORMAL LOW (ref 3.5–5.2)
BUN: 16 mg/dL (ref 6–23)
Calcium: 8.4 mg/dL (ref 8.4–10.5)
Chloride: 105 mEq/L (ref 96–112)
Creatinine, Ser: 0.68 mg/dL (ref 0.50–1.10)
GFR calc non Af Amer: >90 mL/min (ref >90)
Phosphorus: 4.2 mg/dL (ref 2.3–4.6)

## 2013-06-29 LAB — CBC
Platelets: 266 10*3/uL (ref 150–400)
RBC: 5.06 MIL/uL (ref 3.87–5.11)
RDW: 14.5 % (ref 11.5–15.5)
WBC: 6.2 10*3/uL (ref 4.0–10.5)

## 2013-06-29 LAB — TYPE AND SCREEN
ABO/RH(D): O NEG
Antibody Screen: NEGATIVE

## 2013-06-29 LAB — PROTIME-INR: INR: 0.88 (ref 0.00–1.49)

## 2013-06-29 LAB — ABO/RH: ABO/RH(D): O NEG

## 2013-06-29 NOTE — Progress Notes (Signed)
[facility] PROGRESS NOTE  [name] [name] XBJ:[id number] DOB: [birth year] DOA: 06/26/2013 PCP: Kimber Relic, MD  Assessment/Plan: SOB due to edema- improved   Nephrotic syndrome: appreciate renal: HBV negative, HCV negative, ANA, dsDNA, C3 elevated, C4 elevated, HIV   negative, RPR negative, SPEP, sFLCs for w/u.  Lasix increase per nephro. Gradually increase lisinopril   renal biopsy on Monday  tobacco abuse- nicotine patch  DM- SSI, HgbA1C elevated  HTN- IV lasix; resumed ACE- will increase as BP tolerates  Edema- IV lasix  Proteinuria-  From nephrotic syndrome  Hypothyroid-  TSH ok   Code Status: full Family Communication: patient Disposition Plan:    Consultants:  nephro  Procedures:    Antibiotics:    HPI/Subjective: No SOB No CP No fever No chills  Objective: Filed Vitals:   06/29/13 0537  BP: 141/93  Pulse: 90  Temp: 97.1 F (36.2 C)  Resp: 18    Intake/Output Summary (Last 24 hours) at 06/29/13 0757 Last data filed at 06/29/13 0500  Gross per 24 hour  Intake    958 ml  Output   2950 ml  Net  -1992 ml   Filed Weights   06/27/13 0546 06/28/13 0517 06/29/13 0537  Weight: 108.2 kg (238 lb 8.6 oz) 107.049 kg (236 lb) 106.232 kg (234 lb 3.2 oz)    Exam:   General:  A+Ox3, NAD  Cardiovascular: rrr  Respiratory: clear, no wheezing  Abdomen: +BS, soft, distended- less than yesterday  Musculoskeletal: moves all 4 ext, +ptting edema in b/L legs   Data Reviewed: Basic Metabolic Panel:  Recent Labs Lab 06/26/13 1335 06/27/13 0107 06/28/13 0348  NA 137 139 138  K 4.2 3.5 4.8  CL 106 108 107  CO2 23 27 25   GLUCOSE 93 155* 140*  BUN 16 19 18   CREATININE 0.60 0.79 0.74  CALCIUM 8.3* 7.7* 8.3*   Liver Function Tests:  Recent Labs Lab 06/26/13 1335  AST 16  ALT 9  ALKPHOS 91  BILITOT <0.1*  PROT 5.3*  ALBUMIN 0.8*   No results found for this basename: LIPASE, AMYLASE,  in the last 168 hours No results  found for this basename: AMMONIA,  in the last 168 hours CBC:  Recent Labs Lab 06/26/13 1335 06/27/13 0107 06/28/13 0348  WBC 10.8* 7.9 7.6  HGB 15.8* 12.6 13.2  HCT 44.9 37.4 39.1  MCV 82.1 82.9 82.8  PLT 285 241 242   Cardiac Enzymes:  Recent Labs Lab 06/26/13 1335 06/26/13 1910 06/27/13 0107  TROPONINI <0.30 <0.30 <0.30   BNP (last 3 results)  Recent Labs  05/22/13 1811 06/26/13 1335  PROBNP 22.2 52.0   CBG:  Recent Labs Lab 06/28/13 0637 06/28/13 1142 06/28/13 1646 06/28/13 2104 06/29/13 0559  GLUCAP 132* 140* 97 131* 134*    No results found for this or any previous visit (from the past 240 hour(s)).   Studies: US Renal  06/27/2013   CLINICAL DATA:  Nephrotic syndrome  EXAM: RENAL/URINARY TRACT ULTRASOUND COMPLETE  COMPARISON:  11/23/2010  FINDINGS: Right Kidney:  Length: 13.7 cm. Echogenicity within normal limits. No mass or hydronephrosis visualized.  Left Kidney:  Length: 12.7 cm. Echogenicity within normal limits. No mass or hydronephrosis visualized.  Bladder:  Partially distended. Some indentation upon the bladder is noted likely related to the uterus no definitive filling defect is seen.  IMPRESSION: No acute abnormality noted.   Electronically Signed   By: Alcide Clever M.D.   On: 06/27/2013 16:56  Scheduled Meds: . atorvastatin  40 mg Oral q1800  . furosemide  60 mg Intravenous BID  . hydrocortisone   Rectal BID  . insulin aspart  0-15 Units Subcutaneous TID WC  . insulin aspart  0-5 Units Subcutaneous QHS  . lisinopril  15 mg Oral QHS  . nicotine  21 mg Transdermal Daily  . sodium chloride  3 mL Intravenous Q12H  . sodium chloride  3 mL Intravenous Q12H  . Vilazodone HCl  40 mg Oral Q breakfast   Continuous Infusions:   Active Problems:   Hypertension   Type II or unspecified type diabetes mellitus with neurological manifestations, uncontrolled(250.62)   Edema   SOB (shortness of breath)   Tobacco abuse    Time spent:     Marlin Canary  [facility] Pager [phone]. If 7PM-7AM, please contact night-coverage at [email or web address], password [facility] 06/29/2013, 7:57 AM  LOS: 3 days

## 2013-06-29 NOTE — Progress Notes (Signed)
Admit: 06/26/2013 LOS: 3  89F w/ DM, HTN, admitted with nephrotic syndrome including weight gain, anasarca, hyperlipidemia, hypoalbuminemia. W/u includes neg HIV, HBV, HCV; nl C3, C4.  Others pending.   Subjective:  Feels well, less abd distension.  Eating ok Weight improving  12/13 0701 - 12/14 0700 In: 958 [P.O.:958] Out: 2950 [Urine:2950]  Filed Weights   06/27/13 0546 06/28/13 0517 06/29/13 0537  Weight: 108.2 kg (238 lb 8.6 oz) 107.049 kg (236 lb) 106.232 kg (234 lb 3.2 oz)    Current meds: reviewed including, Lasix 60 IV BID, lisinopril 15 qhs, atorvastatin 40 Current Labs: reviewed, this AM pending   Physical Exam:  Blood pressure 141/93, pulse 90, temperature 97.1 F (36.2 C), temperature source Oral, resp. rate 18, height 5\' 1"  (1.549 m), weight 106.232 kg (234 lb 3.2 oz), SpO2 100.00%. GEN: NAD, obese  ENT: NCAT, poor dentition.  EYES: EOMI, muddy sclera  CV: RRR. No rub  PULM: CTAB. No crackles. Nl wob  ABD: obese, soft,nt  SKIN: no rashes/lesions  EXT:4+ pitting edema into thighs.   Assessment/Plan 1. Nephrotic Syndrome. (24h U Protein 11.6 and UP/C 8.2)  Though this could be diabetic nephropathy, it is not convincing.  Serologies, C3, C4 to this point negative.  Others remain outstanding.  Renal Bx tomorrow.  NPO p MN, T&S, coags, CBC this AM.  LMWH on hold.  Path form in paper chart. If GFR stable, further titrate up ACEi.    2. Anasarca / Edema: lasix at 60IV BID.  Na restricted diet.  Diuresing well.  Consider switch to PO lasix in next 24h and transition to outpt regimen.   3. Hyperlipidemia: started atorvastatin 40 on 12/12  [name] MD 06/29/2013, 8:37 AM   Recent Labs Lab 06/26/13 1335 06/27/13 0107 06/28/13 0348  NA 137 139 138  K 4.2 3.5 4.8  CL 106 108 107  CO2 23 27 25   GLUCOSE 93 155* 140*  BUN 16 19 18   CREATININE 0.60 0.79 0.74  CALCIUM 8.3* 7.7* 8.3*    Recent Labs Lab 06/26/13 1335 06/27/13 0107 06/28/13 0348  WBC 10.8*  7.9 7.6  HGB 15.8* 12.6 13.2  HCT 44.9 37.4 39.1  MCV 82.1 82.9 82.8  PLT 285 241 242

## 2013-06-29 NOTE — Plan of Care (Signed)
Problem: Phase I Progression Outcomes Goal: EF % per last Echo/documented,Core Reminder form on chart Outcome: Completed/Met Date Met:  06/29/13 EF 60 - 65% from 06/26/2013

## 2013-06-30 ENCOUNTER — Inpatient Hospital Stay (HOSPITAL_COMMUNITY): Payer: Medicare Other

## 2013-06-30 LAB — RENAL FUNCTION PANEL
BUN: 16 mg/dL (ref 6–23)
CO2: 23 mEq/L (ref 19–32)
Calcium: 8.5 mg/dL (ref 8.4–10.5)
Chloride: 106 mEq/L (ref 96–112)
Creatinine, Ser: 0.6 mg/dL (ref 0.50–1.10)
GFR calc Af Amer: >90 mL/min (ref >90)
Glucose, Bld: 142 mg/dL — ABNORMAL HIGH (ref 70–99)
Sodium: 137 mEq/L (ref 135–145)

## 2013-06-30 LAB — GLUCOSE, CAPILLARY
Glucose-Capillary: 162 mg/dL — ABNORMAL HIGH (ref 70–99)
Glucose-Capillary: 114 mg/dL — ABNORMAL HIGH (ref 70–99)
Glucose-Capillary: 141 mg/dL — ABNORMAL HIGH (ref 70–99)
Glucose-Capillary: 110 mg/dL — ABNORMAL HIGH (ref 70–99)

## 2013-06-30 LAB — ANCA SCREEN W REFLEX TITER
Atypical p-ANCA Screen: NEGATIVE
p-ANCA Screen: NEGATIVE

## 2013-06-30 LAB — MPO/PR-3 (ANCA) ANTIBODIES: Myeloperoxidase Abs: <1 AU/mL (ref <20)

## 2013-06-30 LAB — ANTI-DNA ANTIBODY, DOUBLE-STRANDED: ds DNA Ab: 1 IU/mL (ref <30)

## 2013-06-30 LAB — KAPPA/LAMBDA LIGHT CHAINS: Lambda free light chains: 1.95 mg/dL (ref 0.57–2.63)

## 2013-06-30 MED ORDER — SODIUM CHLORIDE 0.9 % IV SOLN
INTRAVENOUS | Status: AC | PRN
  Administered 2013-06-30: 11:00:00 30 mL via INTRAVENOUS

## 2013-06-30 MED ORDER — FENTANYL CITRATE 0.05 MG/ML IJ SOLN
INTRAMUSCULAR | Status: AC
  Filled 2013-06-30: qty 4

## 2013-06-30 MED ORDER — FENTANYL CITRATE 0.05 MG/ML IJ SOLN
INTRAMUSCULAR | Status: AC | PRN
  Administered 2013-06-30: 50 ug via INTRAVENOUS

## 2013-06-30 MED ORDER — INSULIN ASPART 100 UNIT/ML ~~LOC~~ SOLN: 0.0000 [IU] | Freq: Every day | SUBCUTANEOUS | Status: DC

## 2013-06-30 MED ORDER — MIDAZOLAM HCL 2 MG/2ML IJ SOLN
INTRAMUSCULAR | Status: AC
  Filled 2013-06-30: qty 4

## 2013-06-30 MED ORDER — INSULIN GLARGINE 100 UNIT/ML ~~LOC~~ SOLN
5.0000 [IU] | Freq: Every day | SUBCUTANEOUS | Status: DC
  Administered 2013-06-30: 5 [IU] via SUBCUTANEOUS
  Filled 2013-06-30 (×2): qty 0.05

## 2013-06-30 MED ORDER — INSULIN ASPART 100 UNIT/ML ~~LOC~~ SOLN
0.0000 [IU] | Freq: Three times a day (TID) | SUBCUTANEOUS | Status: DC
  Administered 2013-06-30: 18:00:00 3 [IU] via SUBCUTANEOUS

## 2013-06-30 MED ORDER — LORAZEPAM 2 MG/ML IJ SOLN
INTRAMUSCULAR | Status: AC | PRN
  Administered 2013-06-30: 11:00:00 1 mg via INTRAVENOUS

## 2013-06-30 NOTE — Procedures (Signed)
Procedure:  Ultrasound guided core biopsy of right kidney Findings:  16 G core biopsy of LP cortex of right kidney.  Intact samples.  No immediate evidence of bleeding complication by [place].

## 2013-06-30 NOTE — Progress Notes (Signed)
Pt's band aide from renal bx site is bloody, no hematoma present,VS wdl.  Paged MD, will continue to monitor.

## 2013-06-30 NOTE — Care Management Note (Addendum)
  Page 1 of 1   06/30/2013     3:56:44 PM   CARE MANAGEMENT NOTE 06/30/2013  Patient:  [name] [name], [name] [name]   Account Number:  [id number]  Date Initiated:  06/30/2013  Documentation initiated by:  Oletta Cohn  Subjective/Objective Assessment:   52 y.o. female  With multiple medical problems comes in for the last 2 months, she has had increasing swelling- 30lbs.//Home with children     Action/Plan:   IV lasix, echo, cycle CE, check EKG//Home with [facility]   Anticipated DC Date:  07/03/2013   Anticipated DC Plan:  HOME W HOME HEALTH SERVICES      DC Planning Services  CM consult      [facility] Choice  HOME HEALTH   Choice offered to / List presented to:             Status of service:   Medicare Important Message given?   (If response is "NO", the following Medicare IM given date fields will be blank) Date Medicare IM given:   Date Additional Medicare IM given:    Discharge Disposition:    Per UR Regulation:    If discussed at Long Length of Stay Meetings, dates discussed:    Comments:

## 2013-06-30 NOTE — H&P (Signed)
Agree.  For renal biopsy today.

## 2013-06-30 NOTE — Progress Notes (Signed)
[facility] PROGRESS NOTE  [name] [name] NFA:[id number] DOB: 1961/04/29 DOA: 06/26/2013 PCP: Kimber Relic, MD  Assessment/Plan: SOB due to edema- improved   Nephrotic syndrome: appreciate renal: HBV negative, HCV negative, ANA, dsDNA, C3 elevated, C4 elevated, HIV   negative, RPR negative, SPEP, sFLCs for w/u.  Lasix increase per nephro. Gradually increase lisinopril   renal biopsy 12/15 -weight down 10 lbs  tobacco abuse- nicotine patch  DM- SSI, HgbA1C elevated; add low dose lantus  HTN- IV lasix- change to PO lasix soon; resumed ACE- will increase as BP tolerates  Edema- weight decreasing  Proteinuria-  From nephrotic syndrome  Hypothyroid-  TSH ok   Code Status: full Family Communication: patient Disposition Plan:    Consultants:  nephro  Procedures:    Antibiotics:    HPI/Subjective: Feet still swollen, abd feels better No SOB, no CP  Objective: Filed Vitals:   06/30/13 0507  BP: 133/85  Pulse: 90  Temp: 97.9 F (36.6 C)  Resp: 20    Intake/Output Summary (Last 24 hours) at 06/30/13 0801 Last data filed at 06/30/13 0646  Gross per 24 hour  Intake    940 ml  Output   3525 ml  Net  -2585 ml   Filed Weights   06/28/13 0517 06/29/13 0537 06/30/13 0507  Weight: 107.049 kg (236 lb) 106.232 kg (234 lb 3.2 oz) 104.7 kg (230 lb 13.2 oz)    Exam:   General:  A+Ox3, NAD  Cardiovascular: rrr  Respiratory: clear, no wheezing  Abdomen: +BS, soft, distended- less than yesterday  Musculoskeletal: moves all 4 ext, +pitting edema in b/L legs   Data Reviewed: Basic Metabolic Panel:  Recent Labs Lab 06/26/13 1335 06/27/13 0107 06/28/13 0348 06/29/13 0900 06/30/13 0418  NA 137 139 138 138 137  K 4.2 3.5 4.8 3.9 4.6  CL 106 108 107 105 106  CO2 23 27 25 26 23   GLUCOSE 93 155* 140* 141* 142*  BUN 16 19 18 16 16   CREATININE 0.60 0.79 0.74 0.68 0.60  CALCIUM 8.3* 7.7* 8.3* 8.4 8.5  PHOS  --   --   --  4.2 4.3   Liver  Function Tests:  Recent Labs Lab 06/26/13 1335 06/29/13 0900 06/30/13 0418  AST 16  --   --   ALT 9  --   --   ALKPHOS 91  --   --   BILITOT <0.1*  --   --   PROT 5.3*  --   --   ALBUMIN 0.8* 1.2* 0.9*   No results found for this basename: LIPASE, AMYLASE,  in the last 168 hours No results found for this basename: AMMONIA,  in the last 168 hours CBC:  Recent Labs Lab 06/26/13 1335 06/27/13 0107 06/28/13 0348 06/29/13 0900  WBC 10.8* 7.9 7.6 6.2  HGB 15.8* 12.6 13.2 13.8  HCT 44.9 37.4 39.1 40.8  MCV 82.1 82.9 82.8 80.6  PLT 285 241 242 266   Cardiac Enzymes:  Recent Labs Lab 06/26/13 1335 06/26/13 1910 06/27/13 0107  TROPONINI <0.30 <0.30 <0.30   BNP (last 3 results)  Recent Labs  05/22/13 1811 06/26/13 1335  PROBNP 22.2 52.0   CBG:  Recent Labs Lab 06/29/13 0559 06/29/13 1142 06/29/13 1624 06/29/13 2105 06/30/13 0615  GLUCAP 134* 159* 164* 158* 162*    No results found for this or any previous visit (from the past 240 hour(s)).   Studies: No results found.  Scheduled Meds: . atorvastatin  40 mg Oral  q1800  . furosemide  60 mg Intravenous BID  . hydrocortisone   Rectal BID  . insulin aspart  0-15 Units Subcutaneous TID WC  . insulin aspart  0-5 Units Subcutaneous QHS  . lisinopril  15 mg Oral QHS  . nicotine  21 mg Transdermal Daily  . sodium chloride  3 mL Intravenous Q12H  . sodium chloride  3 mL Intravenous Q12H  . Vilazodone HCl  40 mg Oral Q breakfast   Continuous Infusions:   Active Problems:   Hypertension   Type II or unspecified type diabetes mellitus with neurological manifestations, uncontrolled(250.62)   Edema   SOB (shortness of breath)   Tobacco abuse    Time spent:    Marlin Canary  [facility] Pager [phone]. If 7PM-7AM, please contact night-coverage at [email or web address], password [facility] 06/30/2013, 8:01 AM  LOS: 4 days

## 2013-06-30 NOTE — ED Notes (Signed)
O2 d/c'd 

## 2013-06-30 NOTE — Progress Notes (Signed)
Patient ID: [name] [name], female   DOB: [birth year], 52 y.o.   MRN: [id number] S:feels good, no c/o O:BP 130/82  Pulse 84  Temp(Src) 97.9 F (36.6 C) (Oral)  Resp 12  Ht 5\' 1"  (1.549 m)  Wt 104.7 kg (230 lb 13.2 oz)  BMI 43.64 kg/m2  SpO2 95%  Intake/Output Summary (Last 24 hours) at 06/30/13 1505 Last data filed at 06/30/13 1308  Gross per 24 hour  Intake    940 ml  Output   2975 ml  Net  -2035 ml   Intake/Output: I/O last 3 completed shifts: In: 1058 [P.O.:1058] Out: 4875 [Urine:4875]  Intake/Output this shift:  Total I/O In: 480 [P.O.:480] Out: 1000 [Urine:1000] Weight change: -1.532 kg (-3 lb 6.1 oz) Gen:WD obese AAF in NAD CVS:no rub Resp:cta XLK:GMWNUU Ext:2+edema   Recent Labs Lab 06/26/13 1335 06/27/13 0107 06/28/13 0348 06/29/13 0900 06/30/13 0418  NA 137 139 138 138 137  K 4.2 3.5 4.8 3.9 4.6  CL 106 108 107 105 106  CO2 23 27 25 26 23   GLUCOSE 93 155* 140* 141* 142*  BUN 16 19 18 16 16   CREATININE 0.60 0.79 0.74 0.68 0.60  ALBUMIN 0.8*  --   --  1.2* 0.9*  CALCIUM 8.3* 7.7* 8.3* 8.4 8.5  PHOS  --   --   --  4.2 4.3  AST 16  --   --   --   --   ALT 9  --   --   --   --    Liver Function Tests:  Recent Labs Lab 06/26/13 1335 06/29/13 0900 06/30/13 0418  AST 16  --   --   ALT 9  --   --   ALKPHOS 91  --   --   BILITOT <0.1*  --   --   PROT 5.3*  --   --   ALBUMIN 0.8* 1.2* 0.9*   No results found for this basename: LIPASE, AMYLASE,  in the last 168 hours No results found for this basename: AMMONIA,  in the last 168 hours CBC:  Recent Labs Lab 06/26/13 1335 06/27/13 0107 06/28/13 0348 06/29/13 0900  WBC 10.8* 7.9 7.6 6.2  HGB 15.8* 12.6 13.2 13.8  HCT 44.9 37.4 39.1 40.8  MCV 82.1 82.9 82.8 80.6  PLT 285 241 242 266   Cardiac Enzymes:  Recent Labs Lab 06/26/13 1335 06/26/13 1910 06/27/13 0107  TROPONINI <0.30 <0.30 <0.30   CBG:  Recent Labs Lab 06/29/13 1142 06/29/13 1624 06/29/13 2105 06/30/13 0615  06/30/13 1200  GLUCAP 159* 164* 158* 162* 114*    Iron Studies: No results found for this basename: IRON, TIBC, TRANSFERRIN, FERRITIN,  in the last 72 hours Studies/Results: No results found. Marland Kitchen atorvastatin  40 mg Oral q1800  . fentaNYL      . furosemide  60 mg Intravenous BID  . hydrocortisone   Rectal BID  . insulin aspart  0-20 Units Subcutaneous TID WC  . insulin aspart  0-5 Units Subcutaneous QHS  . insulin glargine  5 Units Subcutaneous QHS  . lisinopril  15 mg Oral QHS  . midazolam      . nicotine  21 mg Transdermal Daily  . sodium chloride  3 mL Intravenous Q12H  . sodium chloride  3 mL Intravenous Q12H  . Vilazodone HCl  40 mg Oral Q breakfast    BMET    Component Value Date/Time   NA 137 06/30/2013 0418   NA 142 06/19/2013 0925  K 4.6 06/30/2013 0418   CL 106 06/30/2013 0418   CO2 23 06/30/2013 0418   GLUCOSE 142* 06/30/2013 0418   GLUCOSE 127* 06/19/2013 0925   BUN 16 06/30/2013 0418   BUN 14 06/19/2013 0925   CREATININE 0.60 06/30/2013 0418   CALCIUM 8.5 06/30/2013 0418   GFRNONAA >90 06/30/2013 0418   GFRAA >90 06/30/2013 0418   CBC    Component Value Date/Time   WBC 6.2 06/29/2013 0900   WBC 7.9 06/19/2013 0925   RBC 5.06 06/29/2013 0900   RBC 5.67* 06/19/2013 0925   HGB 13.8 06/29/2013 0900   HCT 40.8 06/29/2013 0900   PLT 266 06/29/2013 0900   MCV 80.6 06/29/2013 0900   MCH 27.3 06/29/2013 0900   MCH 28.2 06/19/2013 0925   MCHC 33.8 06/29/2013 0900   MCHC 34.1 06/19/2013 0925   RDW 14.5 06/29/2013 0900   RDW 15.3 06/19/2013 0925   LYMPHSABS 3.2* 06/19/2013 0925   LYMPHSABS 3.3 08/18/2012 1041   MONOABS 0.5 08/18/2012 1041   EOSABS 0.4 06/19/2013 0925   EOSABS 0.2 08/18/2012 1041   BASOSABS 0.0 06/19/2013 0925   BASOSABS 0.0 08/18/2012 1041     Assessment/Plan:  1. Nephrotic syndrome- s/p renal biopsy.  Serologies pending.  Hold off on empiric prednisone given h/o DM with complications.  Cont with BP control and diuresis.  DDx Minimal change disease,  FSGS, Membranous, obesity-related FSGS.   2. DM- per primary svc 3. HTN- at goal 4. Morbid obesity 5. Dispo- cont with diuresis but does not need to stay for results as she can f/u with Dr. Marisue [name] as an outpt if she cont to respond to diuresis and Hgb remains stable.  [name] [name]

## 2013-07-01 DIAGNOSIS — F17200 Nicotine dependence, unspecified, uncomplicated: Secondary | ICD-10-CM

## 2013-07-01 LAB — GLUCOSE, CAPILLARY
Glucose-Capillary: 115 mg/dL — ABNORMAL HIGH (ref 70–99)
Glucose-Capillary: 124 mg/dL — ABNORMAL HIGH (ref 70–99)

## 2013-07-01 LAB — RENAL FUNCTION PANEL
BUN: 19 mg/dL (ref 6–23)
Chloride: 107 mEq/L (ref 96–112)
Glucose, Bld: 111 mg/dL — ABNORMAL HIGH (ref 70–99)
Potassium: 3.9 mEq/L (ref 3.5–5.1)

## 2013-07-01 LAB — PROTEIN ELECTROPHORESIS, SERUM
Beta Globulin: 4.9 % (ref 4.7–7.2)
Gamma Globulin: 8.1 % — ABNORMAL LOW (ref 11.1–18.8)
M-Spike, %: NOT DETECTED g/dL
Total Protein ELP: 3.3 g/dL — ABNORMAL LOW (ref 6.0–8.3)

## 2013-07-01 LAB — CBC
HCT: 39 % (ref 36.0–46.0)
MCH: 27.2 pg (ref 26.0–34.0)
MCHC: 32.8 g/dL (ref 30.0–36.0)
Platelets: 258 10*3/uL (ref 150–400)
RDW: 14.8 % (ref 11.5–15.5)

## 2013-07-01 MED ORDER — ATORVASTATIN CALCIUM 40 MG PO TABS: 40.0000 mg | ORAL_TABLET | Freq: Every day | ORAL | Status: DC

## 2013-07-01 MED ORDER — FUROSEMIDE 20 MG PO TABS: 60.0000 mg | ORAL_TABLET | Freq: Two times a day (BID) | ORAL | Status: DC

## 2013-07-01 MED ORDER — NICOTINE 21 MG/24HR TD PT24: 21.0000 mg | MEDICATED_PATCH | Freq: Every day | TRANSDERMAL | Status: DC

## 2013-07-01 MED ORDER — LISINOPRIL 5 MG PO TABS: 15.0000 mg | ORAL_TABLET | Freq: Every day | ORAL | Status: DC

## 2013-07-01 NOTE — Progress Notes (Signed)
Pts CBC resulted, MD notified.

## 2013-07-01 NOTE — Discharge Summary (Signed)
Physician Discharge Summary  [name] [name] RUE:[id number] DOB: 08/15/1960 DOA: 06/26/2013  PCP: Kimber Relic, MD  Admit date: 06/26/2013 Discharge date: 07/01/2013  Time spent: 35 minutes  Recommendations for Outpatient Follow-up:  1. Titration of diabetic meds 2. Renal follow up for biopsy results 3. Cbc, bmp 1 week for  Cr and hgb  Discharge Diagnoses:  Active Problems:   Hypertension   Type II or unspecified type diabetes mellitus with neurological manifestations, uncontrolled(250.62)   Edema   SOB (shortness of breath)   Tobacco abuse   Discharge Condition: improved  Diet recommendation: diabetic/cardiac  Filed Weights   06/29/13 0537 06/30/13 0507 07/01/13 0518  Weight: 106.232 kg (234 lb 3.2 oz) 104.7 kg (230 lb 13.2 oz) 103.1 kg (227 lb 4.7 oz)    History of present illness:  [name] [name] is a 52 y.o. female  With multiple medical problems comes in for the last 2 months, she has had increasing swelling- 30lbs. She was started on demadex and aldactone by here PCP. This helped increase her urination for 1 day but then stopped working per patient.  She was seen in the ER on Nov 6th with protein in urine and PNA.  She is unable to lay flat  Last saw Dr. Allyson Sabal in 2009 where she had a cath- reported to be 70% stenosis  She reports occasional chest pain- comes and goes.  This AM, she had increasing SOB and was seen by her PCP and sent to hospital for further work up   Hospital Course:  SOB due to edema- improved   Nephrotic syndrome: appreciate renal: HBV negative, HCV negative, ANA, dsDNA, C3 elevated, C4 elevated, HIV negative, RPR negative, SPEP, sFLCs for w/u. Lasix  per nephro. Gradually increase lisinopril  renal biopsy 12/15  -weight down 20 lbs   tobacco abuse- nicotine patch   DM- SSI, HgbA1C elevated; resume home meds with diabetic diet- titrate as outpatient   HTN- change to PO lasix soon; increase ACE  Edema- weight decreasing    Proteinuria- From nephrotic syndrome   Hypothyroid- TSH ok   Procedures:  Renal biopsy 12/15  Consultations:  Renal  IR  Discharge Exam: Filed Vitals:   07/01/13 0518  BP: 121/81  Pulse: 73  Temp: 97.8 F (36.6 C)  Resp: 18    General: A+Ox3, NAD Cardiovascular: rrr Respiratory: clear anterior  Discharge Instructions      Discharge Orders   Future Orders Complete By Expires   Diet - low sodium heart healthy  As directed    Diet Carb Modified  As directed    Discharge instructions  As directed    Comments:     STOP SMOKING   Increase activity slowly  As directed        Medication List    STOP taking these medications       BC HEADACHE POWDER PO     simvastatin 20 MG tablet  Commonly known as:  ZOCOR     spironolactone 25 MG tablet  Commonly known as:  ALDACTONE     torsemide 20 MG tablet  Commonly known as:  DEMADEX      TAKE these medications       albuterol (2.5 MG/3ML) 0.083% nebulizer solution  Commonly known as:  PROVENTIL  Take 2.5 mg by nebulization every 6 (six) hours as needed for shortness of breath.     albuterol-ipratropium 18-103 MCG/ACT inhaler  Commonly known as:  COMBIVENT  Inhale 2 puffs into the lungs  every 6 (six) hours as needed for wheezing or shortness of breath.     ALPRAZolam 1 MG tablet  Commonly known as:  XANAX  Take 1 mg by mouth 3 (three) times daily as needed for anxiety.     atorvastatin 40 MG tablet  Commonly known as:  LIPITOR  Take 1 tablet (40 mg total) by mouth daily at 6 PM.     chlorhexidine 0.12 % solution  Commonly known as:  PERIDEX  Use as directed 15 mLs in the mouth or throat daily as needed (for thrush).     fluconazole 100 MG tablet  Commonly known as:  DIFLUCAN  Take 100 mg by mouth daily as needed (for yeast infection).     furosemide 20 MG tablet  Commonly known as:  LASIX  Take 3 tablets (60 mg total) by mouth 2 (two) times daily.     glipiZIDE 5 MG 24 hr tablet  Commonly  known as:  GLUCOTROL XL  Take 1 tablet (5 mg total) by mouth daily.     lactulose 10 GM/15ML solution  Commonly known as:  CHRONULAC  Take 20 g by mouth daily as needed for mild constipation.     lisinopril 5 MG tablet  Commonly known as:  PRINIVIL,ZESTRIL  Take 3 tablets (15 mg total) by mouth at bedtime.     metFORMIN 1000 MG tablet  Commonly known as:  GLUCOPHAGE  Take one tablet twice a day for diabetes 250.00     nicotine 21 mg/24hr patch  Commonly known as:  NICODERM CQ - dosed in mg/24 hours  Place 1 patch (21 mg total) onto the skin daily.     Oxycodone HCl 10 MG Tabs  Take one tablet up to 6 times as needed to control pain     promethazine 12.5 MG tablet  Commonly known as:  PHENERGAN  Take 12.5 mg by mouth every 6 (six) hours as needed for nausea or vomiting.     VIIBRYD 40 MG Tabs  Generic drug:  Vilazodone HCl  Take 40 mg by mouth daily.       Allergies  Allergen Reactions  . Avelox [Moxifloxacin Hcl In Nacl] Swelling  . Chantix [Varenicline] Nausea Only  . Imipramine Hcl Swelling  . Tetracyclines & Related Swelling   Follow-up Information   Follow up with Arita Miss, MD On 07/14/2013. (arrive at 3:45pm)    Specialty:  Nephrology   Contact information:   [address] [place] [address] [phone]       Follow up with GREEN, Lenon Curt, MD In 1 week.   Specialty:  Internal Medicine   Contact information:   [address] [place] [address] [phone]        The results of significant diagnostics from this hospitalization (including imaging, microbiology, ancillary and laboratory) are listed below for reference.    Significant Diagnostic Studies: Dg Chest 2 View  06/26/2013   CLINICAL DATA:  History of pneumonia, shortness of breath.  EXAM: CHEST  2 VIEW  COMPARISON:  None.  FINDINGS: Low lung volumes. The heart size and mediastinal contours are within normal limits. Both lungs are clear. The visualized skeletal structures are  unremarkable.  IMPRESSION: No active cardiopulmonary disease.   Electronically Signed   By: Salome Holmes M.D.   On: 06/26/2013 16:46   US Renal  06/27/2013   CLINICAL DATA:  Nephrotic syndrome  EXAM: RENAL/URINARY TRACT ULTRASOUND COMPLETE  COMPARISON:  11/23/2010  FINDINGS: Right Kidney:  Length: 13.7 cm. Echogenicity within normal limits. No mass or hydronephrosis visualized.  Left Kidney:  Length: 12.7 cm. Echogenicity within normal limits. No mass or hydronephrosis visualized.  Bladder:  Partially distended. Some indentation upon the bladder is noted likely related to the uterus no definitive filling defect is seen.  IMPRESSION: No acute abnormality noted.   Electronically Signed   By: Alcide Clever M.D.   On: 06/27/2013 16:56   US Biopsy  06/30/2013   CLINICAL DATA:  Nephrotic syndrome and need for renal biopsy.  EXAM: ULTRASOUND GUIDED CORE BIOPSY OF RIGHT KIDNEY  MEDICATIONS: 1.0 mg IV Versed; 50 mcg IV Fentanyl  Total Moderate Sedation Time: 9.0 min  PROCEDURE: The procedure, risks, benefits, and alternatives were explained to the patient. Questions regarding the procedure were encouraged and answered. The patient understands and consents to the procedure.  The right posterior flank region was prepped with Betadine in a sterile fashion, and a sterile drape was applied covering the operative field. A sterile gown and sterile gloves were used for the procedure. Local anesthesia was provided with 1% Lidocaine.  Both kidneys were examined by ultrasound. The right was chosen for biopsy. Under ultrasound guidance, a 16 gauge core device was advanced to the posterior lower pole cortex. Two samples were obtained and submitted in saline for nephropathologic examination. Postprocedural ultrasound was also performed.  COMPLICATIONS: None.  FINDINGS: The right kidney was more superficial in location with better visualized and thicker lower pole cortex compared to the left. Solid and intact core biopsy specimens  were obtained.  IMPRESSION: Ultrasound-guided core biopsy performed of the right kidney at the level of posterior lower pole cortex.   Electronically Signed   By: Irish Lack M.D.   On: 06/30/2013 15:30    Microbiology: No results found for this or any previous visit (from the past 240 hour(s)).   Labs: Basic Metabolic Panel:  Recent Labs Lab 06/27/13 0107 06/28/13 0348 06/29/13 0900 06/30/13 0418 07/01/13 0540  NA 139 138 138 137 141  K 3.5 4.8 3.9 4.6 3.9  CL 108 107 105 106 107  CO2 27 25 26 23 28   GLUCOSE 155* 140* 141* 142* 111*  BUN 19 18 16 16 19   CREATININE 0.79 0.74 0.68 0.60 0.80  CALCIUM 7.7* 8.3* 8.4 8.5 8.4  PHOS  --   --  4.2 4.3 4.5   Liver Function Tests:  Recent Labs Lab 06/26/13 1335 06/29/13 0900 06/30/13 0418 07/01/13 0540  AST 16  --   --   --   ALT 9  --   --   --   ALKPHOS 91  --   --   --   BILITOT <0.1*  --   --   --   PROT 5.3*  --   --   --   ALBUMIN 0.8* 1.2* 0.9* 1.0*   No results found for this basename: LIPASE, AMYLASE,  in the last 168 hours No results found for this basename: AMMONIA,  in the last 168 hours CBC:  Recent Labs Lab 06/26/13 1335 06/27/13 0107 06/28/13 0348 06/29/13 0900  WBC 10.8* 7.9 7.6 6.2  HGB 15.8* 12.6 13.2 13.8  HCT 44.9 37.4 39.1 40.8  MCV 82.1 82.9 82.8 80.6  PLT 285 241 242 266   Cardiac Enzymes:  Recent Labs Lab 06/26/13 1335 06/26/13 1910 06/27/13 0107  TROPONINI <0.30 <0.30 <0.30   BNP: BNP (last 3 results)  Recent Labs  05/22/13 1811 06/26/13 1335  PROBNP 22.2 52.0  CBG:  Recent Labs Lab 06/30/13 0615 06/30/13 1200 06/30/13 1620 06/30/13 2017 07/01/13 0726  GLUCAP 162* 114* 141* 110* 115*       Signed:  Jailynne Opperman  [facility] 07/01/2013, 9:28 AM

## 2013-07-07 ENCOUNTER — Encounter (HOSPITAL_COMMUNITY): Payer: Self-pay

## 2013-07-08 ENCOUNTER — Encounter: Payer: Self-pay | Admitting: Internal Medicine

## 2013-07-08 ENCOUNTER — Ambulatory Visit (INDEPENDENT_AMBULATORY_CARE_PROVIDER_SITE_OTHER): Payer: Medicare Other | Admitting: Internal Medicine

## 2013-07-08 VITALS — BP 130/74 | HR 107 | Temp 98.4°F | Wt 229.6 lb

## 2013-07-08 DIAGNOSIS — N049 Nephrotic syndrome with unspecified morphologic changes: Secondary | ICD-10-CM

## 2013-07-08 DIAGNOSIS — K5090 Crohn's disease, unspecified, without complications: Secondary | ICD-10-CM

## 2013-07-08 DIAGNOSIS — R609 Edema, unspecified: Secondary | ICD-10-CM

## 2013-07-08 DIAGNOSIS — E1149 Type 2 diabetes mellitus with other diabetic neurological complication: Secondary | ICD-10-CM

## 2013-07-08 DIAGNOSIS — R110 Nausea: Secondary | ICD-10-CM

## 2013-07-08 MED ORDER — ONDANSETRON 4 MG PO TBDP: 4.0000 mg | ORAL_TABLET | Freq: Three times a day (TID) | ORAL | Status: AC | PRN

## 2013-07-08 NOTE — Progress Notes (Signed)
Patient ID: [name] [name], female   DOB: 1960/10/19, 52 y.o.   MRN: [id number]     Chief Complaint  Patient presents with  . Hospitalization Follow-up    hospital f/u  . other    ? Crohns is flairing up/causing swelling in the abdomen    Allergies  Allergen Reactions  . Avelox [Moxifloxacin Hcl In Nacl] Swelling  . Chantix [Varenicline] Nausea Only  . Imipramine Hcl Swelling  . Tetracyclines & Related Swelling    HPI 52 y/o female patient is here for hospital follow up from 06/26/13- 07/01/13 with anasarca, SOB and questionable pneumonia. She was diuresed and lost 20 lbs. She also underwent renal biopsy 06/30/13. Her breathing improved. cxr did not show any infiltrates. Her urine protein was suggestive of nephrotic syndrome and she had workup for this.  She is seen today in the clinic. She get dyspneic with moderate exertion. Fluid builds up as the day progresses. She is taking her lasix 60 mg bid. She has gained 2 lbs since last discharge. Has back pain. Has been nauseated recently and feels gasey and bloated. She has ocassional rectal bleed and has hx of chron's disease. She has not seen her Gi Dr Madilyn Fireman for sometime and would like a follow up appointment set up cbg below 150 at home  Review of Systems  Constitutional: Negative for fever, chills, weight loss, malaise/fatigue and diaphoresis.  HENT: Negative for congestion, hearing loss and sore throat.   Eyes: Negative for blurred vision, double vision and discharge.  Respiratory: Negative for cough, sputum production, wheezing.   Cardiovascular: Negative for chest pain, palpitations Gastrointestinal: Negative for heartburn,  abdominal pain, diarrhea  Genitourinary: Negative for dysuria, urgency, frequency and flank pain.  Musculoskeletal: Negative for falls, joint pain and myalgias. has back pain Skin: Negative for itching and rash.  Neurological: Positive for weakness. Negative for dizziness, tingling, focal weakness and  headaches.  Psychiatric/Behavioral: Negative for depression and memory loss. The patient is not nervous/anxious.    Past Medical History  Diagnosis Date  . Crohn's disease     hx of crohn's dz  . COPD (chronic obstructive pulmonary disease)   . Coronary artery disease   . Hypertension   . Diabetes mellitus   . Thyroid disease     hypothyroidism  . Arthritis   . Asthma   . AC (acromioclavicular) joint bone spurs     neck  . Depressed   . Nausea alone   . Unspecified gingival and periodontal disease   . Palpitations   . Allergic rhinitis due to pollen   . Methicillin resistant Staphylococcus aureus septicemia   . Acute bronchitis   . Type II or unspecified type diabetes mellitus without mention of complication, uncontrolled   . Carbuncle and furuncle of trunk   . Abdominal pain, unspecified site   . Sacroiliitis, not elsewhere classified   . Hyperventilation   . Flaccid hemiplegia affecting dominant side   . Pain in joint, pelvic region and thigh   . Cough   . Disturbance of skin sensation   . Herpes zoster with unspecified nervous system complication   . Chest pain, unspecified   . Nonspecific abnormal electrocardiogram (ECG) (EKG)   . Candidiasis of mouth   . Hyperlipidemia   . Tobacco use disorder   . Regional enteritis of large intestine   . Osteoarthrosis, unspecified whether generalized or localized, unspecified site   . Intrinsic asthma, unspecified   . Lumbago   . Blood in stool   .  Unspecified nontoxic nodular goiter   . Anxiety   . Edema   . Abdominal pain, right lower quadrant   . Dysmenorrhea   . Unspecified urinary incontinence   . Regional enteritis of unspecified site   . Type II or unspecified type diabetes mellitus without mention of complication, not stated as uncontrolled   . GERD (gastroesophageal reflux disease)   . Other malaise and fatigue   . Encounter for long-term (current) use of other medications   . Unspecified constipation   .  Irritable bowel syndrome   . Diaphragmatic hernia without mention of obstruction or gangrene   . Protein in urine 06/27/2013   Past Surgical History  Procedure Laterality Date  . Metatarsal reconstruction  1993  . Tubalization  1982  . Right foot surgery in 1993 twice    . Right foot surgery in 1995    . Colonoscopy  09/10/2006  . Tubal ligation      Medication reviewed. See [facility]  Physical exam BP 130/74  Pulse 107  Temp(Src) 98.4 F (36.9 C) (Oral)  Wt 229 lb 9.6 oz (104.146 kg)  SpO2 98%  General- adult female in no acute distress Head- atraumatic, normocephalic Eyes- PERRLA, EOMI, no pallor, no icterus Neck- no lymphadenopathy, no thyromegaly Chest- no chest wall deformities, no chest wall tenderness Cardiovascular- normal s1,s2, no murmurs/ rubs/ gallops Respiratory- bilateral clear to auscultation, no wheeze, no rhonchi, no crackles Abdomen- bowel sounds present, soft, non tender, no distended no guarding or rigidity, no CVA tenderness Musculoskeletal- able to move all 4 extremities, no spinal and paraspinal tenderness, uses a rollator walker Neurological- no focal deficit Psychiatry- alert and oriented to person, place and time, normal mood and affect   Imaging studies Dg Chest 2 View  06/26/2013   CLINICAL DATA:  History of pneumonia, shortness of breath.  EXAM: CHEST  2 VIEW  COMPARISON:  None.  FINDINGS: Low lung volumes. The heart size and mediastinal contours are within normal limits. Both lungs are clear. The visualized skeletal structures are unremarkable.  IMPRESSION: No active cardiopulmonary disease.   Electronically Signed   By: Salome Holmes M.D.   On: 06/26/2013 16:46   US Renal  06/27/2013   CLINICAL DATA:  Nephrotic syndrome  EXAM: RENAL/URINARY TRACT ULTRASOUND COMPLETE  COMPARISON:  11/23/2010  FINDINGS: Right Kidney:  Length: 13.7 cm. Echogenicity within normal limits. No mass or hydronephrosis visualized.  Left Kidney:  Length: 12.7 cm.  Echogenicity within normal limits. No mass or hydronephrosis visualized.  Bladder:  Partially distended. Some indentation upon the bladder is noted likely related to the uterus no definitive filling defect is seen.  IMPRESSION: No acute abnormality noted.   Electronically Signed   By: Alcide Clever M.D.   On: 06/27/2013 16:56   US Biopsy  06/30/2013   CLINICAL DATA:  Nephrotic syndrome and need for renal biopsy.  EXAM: ULTRASOUND GUIDED CORE BIOPSY OF RIGHT KIDNEY  MEDICATIONS: 1.0 mg IV Versed; 50 mcg IV Fentanyl  Total Moderate Sedation Time: 9.0 min  PROCEDURE: The procedure, risks, benefits, and alternatives were explained to the patient. Questions regarding the procedure were encouraged and answered. The patient understands and consents to the procedure.  The right posterior flank region was prepped with Betadine in a sterile fashion, and a sterile drape was applied covering the operative field. A sterile gown and sterile gloves were used for the procedure. Local anesthesia was provided with 1% Lidocaine.  Both kidneys were examined by ultrasound. The right was chosen for  biopsy. Under ultrasound guidance, a 16 gauge core device was advanced to the posterior lower pole cortex. Two samples were obtained and submitted in saline for nephropathologic examination. Postprocedural ultrasound was also performed.  COMPLICATIONS: None.  FINDINGS: The right kidney was more superficial in location with better visualized and thicker lower pole cortex compared to the left. Solid and intact core biopsy specimens were obtained.  IMPRESSION: Ultrasound-guided core biopsy performed of the right kidney at the level of posterior lower pole cortex.   Electronically Signed   By: Irish Lack M.D.   On: 06/30/2013 15:30    Labs- CBC    Component Value Date/Time   WBC 5.8 07/01/2013 0844   WBC 7.9 06/19/2013 0925   RBC 4.71 07/01/2013 0844   RBC 5.67* 06/19/2013 0925   HGB 12.8 07/01/2013 0844   HCT 39.0 07/01/2013  0844   PLT 258 07/01/2013 0844   MCV 82.8 07/01/2013 0844   MCH 27.2 07/01/2013 0844   MCH 28.2 06/19/2013 0925   MCHC 32.8 07/01/2013 0844   MCHC 34.1 06/19/2013 0925   RDW 14.8 07/01/2013 0844   RDW 15.3 06/19/2013 0925   LYMPHSABS 3.2* 06/19/2013 0925   LYMPHSABS 3.3 08/18/2012 1041   MONOABS 0.5 08/18/2012 1041   EOSABS 0.4 06/19/2013 0925   EOSABS 0.2 08/18/2012 1041   BASOSABS 0.0 06/19/2013 0925   BASOSABS 0.0 08/18/2012 1041    CMP     Component Value Date/Time   NA 141 07/01/2013 0540   NA 142 06/19/2013 0925   K 3.9 07/01/2013 0540   CL 107 07/01/2013 0540   CO2 28 07/01/2013 0540   GLUCOSE 111* 07/01/2013 0540   GLUCOSE 127* 06/19/2013 0925   BUN 19 07/01/2013 0540   BUN 14 06/19/2013 0925   CREATININE 0.80 07/01/2013 0540   CALCIUM 8.4 07/01/2013 0540   PROT 5.3* 06/26/2013 1335   PROT 4.1* 06/19/2013 0925   ALBUMIN 1.0* 07/01/2013 0540   AST 16 06/26/2013 1335   ALT 9 06/26/2013 1335   ALKPHOS 91 06/26/2013 1335   BILITOT <0.1* 06/26/2013 1335   GFRNONAA 82* 07/01/2013 0540   GFRAA >90 07/01/2013 0540    Assessment/plan  Nephrotic syndrome- will follow up with renal this Monday, follow recs  Edema- continue lasix bid for now, monitor renal function closely, to keep legs elevated at rest  Chron's disease- will have her see GI for follow up. No recent flare up but has been lost to follow up  Nausea- prn zofran for now and monitor. She could be having some gastroparesis form her DM  Dm type 2- monitor cbg, continue metformin and glipizide for now. Not to skip meals  Dyspnea- imporved. Continue bronchodilator and diuretics. Monitor weight

## 2013-07-09 ENCOUNTER — Encounter (HOSPITAL_COMMUNITY): Payer: Self-pay

## 2013-07-16 ENCOUNTER — Other Ambulatory Visit: Payer: Self-pay | Admitting: *Deleted

## 2013-07-18 ENCOUNTER — Other Ambulatory Visit: Payer: Self-pay | Admitting: *Deleted

## 2013-07-18 MED ORDER — ALPRAZOLAM 1 MG PO TABS: 1.0000 mg | ORAL_TABLET | Freq: Three times a day (TID) | ORAL | Status: DC | PRN

## 2013-07-18 MED ORDER — OXYCODONE HCL 10 MG PO TABS: ORAL_TABLET | ORAL | Status: DC

## 2013-07-18 NOTE — Telephone Encounter (Signed)
rx reprinted in Dr Thomasene Lot name

## 2013-07-31 LAB — BASIC METABOLIC PANEL
BUN: 12 mg/dL (ref 4–21)
Creatinine: 0.7 mg/dL (ref 0.5–1.1)
GLUCOSE: 110 mg/dL
Potassium: 3.7 mmol/L (ref 3.4–5.3)
Sodium: 136 mmol/L — AB (ref 137–147)

## 2013-07-31 LAB — LIPID PANEL
Cholesterol: 407 mg/dL — AB (ref 0–200)
HDL: 43 mg/dL (ref 35–70)
LDL CALC: 309 mg/dL
TRIGLYCERIDES: 274 mg/dL — AB (ref 40–160)

## 2013-08-07 ENCOUNTER — Ambulatory Visit (INDEPENDENT_AMBULATORY_CARE_PROVIDER_SITE_OTHER): Payer: Medicare Other | Admitting: Nurse Practitioner

## 2013-08-07 ENCOUNTER — Encounter: Payer: Self-pay | Admitting: Nurse Practitioner

## 2013-08-07 VITALS — BP 128/70 | HR 93 | Resp 12 | Wt 209.0 lb

## 2013-08-07 DIAGNOSIS — K056 Periodontal disease, unspecified: Secondary | ICD-10-CM

## 2013-08-07 DIAGNOSIS — K069 Disorder of gingiva and edentulous alveolar ridge, unspecified: Secondary | ICD-10-CM

## 2013-08-07 DIAGNOSIS — F17200 Nicotine dependence, unspecified, uncomplicated: Secondary | ICD-10-CM

## 2013-08-07 DIAGNOSIS — I10 Essential (primary) hypertension: Secondary | ICD-10-CM

## 2013-08-07 DIAGNOSIS — Z720 Tobacco use: Secondary | ICD-10-CM

## 2013-08-07 DIAGNOSIS — R609 Edema, unspecified: Secondary | ICD-10-CM

## 2013-08-07 DIAGNOSIS — E1149 Type 2 diabetes mellitus with other diabetic neurological complication: Secondary | ICD-10-CM

## 2013-08-07 DIAGNOSIS — N032 Chronic nephritic syndrome with diffuse membranous glomerulonephritis: Secondary | ICD-10-CM

## 2013-08-07 DIAGNOSIS — K5090 Crohn's disease, unspecified, without complications: Secondary | ICD-10-CM

## 2013-08-07 DIAGNOSIS — R0602 Shortness of breath: Secondary | ICD-10-CM

## 2013-08-07 DIAGNOSIS — K649 Unspecified hemorrhoids: Secondary | ICD-10-CM

## 2013-08-07 DIAGNOSIS — N051 Unspecified nephritic syndrome with focal and segmental glomerular lesions: Secondary | ICD-10-CM

## 2013-08-07 DIAGNOSIS — F329 Major depressive disorder, single episode, unspecified: Secondary | ICD-10-CM

## 2013-08-07 DIAGNOSIS — E785 Hyperlipidemia, unspecified: Secondary | ICD-10-CM

## 2013-08-07 DIAGNOSIS — F32A Depression, unspecified: Secondary | ICD-10-CM

## 2013-08-07 DIAGNOSIS — F3289 Other specified depressive episodes: Secondary | ICD-10-CM

## 2013-08-07 MED ORDER — ATORVASTATIN CALCIUM 40 MG PO TABS: 40.0000 mg | ORAL_TABLET | Freq: Every day | ORAL | Status: AC

## 2013-08-07 MED ORDER — CHLORHEXIDINE GLUCONATE 0.12 % MT SOLN: 15.0000 mL | Freq: Every day | OROMUCOSAL | Status: AC | PRN

## 2013-08-07 MED ORDER — HYDROCORTISONE 2.5 % RE CREA: 1.0000 "application " | TOPICAL_CREAM | Freq: Two times a day (BID) | RECTAL | Status: AC | PRN

## 2013-08-07 NOTE — Progress Notes (Signed)
Patient ID: [name] [name], female   DOB: 1961-07-09, 53 y.o.   MRN: [id number]    Allergies  Allergen Reactions  . Avelox [Moxifloxacin Hcl In Nacl] Swelling  . Chantix [Varenicline] Nausea Only  . Imipramine Hcl Swelling  . Tetracyclines & Related Swelling    Chief Complaint  Patient presents with  . Medical Managment of Chronic Issues    1 month follow-up   . Medication Refill    Renew Lipitor and Peridex   . Hemorrhoids    Request rx- cream   . Medical Equipment Request    Patient requesting order for a hospital bed, patient needs to elevate head when sleeping  and elevate legs     HPI: Patient is a 53 y.o. female seen in the office today for follow up; pt was diagnosed with nephrotic syndrome and Focal Segmental Glomerulosclerosis when she was hospitalized due to anasarca   Pt is following with [facility] here in [place] (Dr Marisue Humble); plan is to follow up with them weekly, they follow her blood pressure, blood work, and protein in urine; in March she plans to start chemo and steroids  Pt reports she feels a lot better has a new attitude on life; shortness of breath is better, swelling has gone down but still having some in her feet; needs a hospital bed so she can properly elevate her feet and head at night-- lasix has been increased to 160 daily  [facility] in monitoring her blood sugars -- doing better with diet and sugar intake Blood sugars have better controlled per pt (no blood sugar log) no hypoglycemic episodes  Having problems with Hemorrhoids since hospitalized OTC has not been effective  She is taking colace and senna which helps with bowels    Review of Systems:  Review of Systems  Constitutional: Negative for fever, chills and malaise/fatigue.  Respiratory: Negative for cough and shortness of breath.   Cardiovascular: Positive for leg swelling (better ). Negative for chest pain, orthopnea and PND.  Gastrointestinal: Positive for constipation  (improved with medication). Negative for abdominal pain.  Genitourinary: Positive for frequency (lasix). Negative for dysuria and urgency.  Musculoskeletal: Positive for myalgias.  Skin: Negative.   Neurological: Negative for dizziness and weakness.  Psychiatric/Behavioral: Negative for depression.     Past Medical History  Diagnosis Date  . Crohn's disease     hx of crohn's dz  . COPD (chronic obstructive pulmonary disease)   . Coronary artery disease   . Hypertension   . Diabetes mellitus   . Thyroid disease     hypothyroidism  . Arthritis   . Asthma   . AC (acromioclavicular) joint bone spurs     neck  . Depressed   . Nausea alone   . Unspecified gingival and periodontal disease   . Palpitations   . Allergic rhinitis due to pollen   . Methicillin resistant Staphylococcus aureus septicemia   . Acute bronchitis   . Type II or unspecified type diabetes mellitus without mention of complication, uncontrolled   . Carbuncle and furuncle of trunk   . Abdominal pain, unspecified site   . Sacroiliitis, not elsewhere classified   . Hyperventilation   . Flaccid hemiplegia affecting dominant side   . Pain in joint, pelvic region and thigh   . Cough   . Disturbance of skin sensation   . Herpes zoster with unspecified nervous system complication   . Chest pain, unspecified   . Nonspecific abnormal electrocardiogram (ECG) (EKG)   .  Candidiasis of mouth   . Hyperlipidemia   . Tobacco use disorder   . Regional enteritis of large intestine   . Osteoarthrosis, unspecified whether generalized or localized, unspecified site   . Intrinsic asthma, unspecified   . Lumbago   . Blood in stool   . Unspecified nontoxic nodular goiter   . Anxiety   . Edema   . Abdominal pain, right lower quadrant   . Dysmenorrhea   . Unspecified urinary incontinence   . Regional enteritis of unspecified site   . Type II or unspecified type diabetes mellitus without mention of complication, not stated as  uncontrolled   . GERD (gastroesophageal reflux disease)   . Other malaise and fatigue   . Encounter for long-term (current) use of other medications   . Unspecified constipation   . Irritable bowel syndrome   . Diaphragmatic hernia without mention of obstruction or gangrene   . Protein in urine 06/27/2013   Past Surgical History  Procedure Laterality Date  . Metatarsal reconstruction  1993  . Tubalization  1982  . Right foot surgery in 1993 twice    . Right foot surgery in 1995    . Colonoscopy  09/10/2006  . Tubal ligation     Social History:   reports that she has been smoking Cigarettes.  She has a 30 pack-year smoking history. She has never used smokeless tobacco. She reports that she does not drink alcohol or use illicit drugs.  Family History  Problem Relation Age of Onset  . Stroke Mother     Medications: Patient's Medications  New Prescriptions   No medications on file  Previous Medications   ALBUTEROL (PROVENTIL) (2.5 MG/3ML) 0.083% NEBULIZER SOLUTION    Take 2.5 mg by nebulization every 6 (six) hours as needed for shortness of breath.    ALBUTEROL-IPRATROPIUM (COMBIVENT) 18-103 MCG/ACT INHALER    Inhale 2 puffs into the lungs every 6 (six) hours as needed for wheezing or shortness of breath.   ALPRAZOLAM (XANAX) 1 MG TABLET    Take 1 tablet (1 mg total) by mouth 3 (three) times daily as needed for anxiety.   ATORVASTATIN (LIPITOR) 40 MG TABLET    Take 1 tablet (40 mg total) by mouth daily at 6 PM.   CHLORHEXIDINE (PERIDEX) 0.12 % SOLUTION    Use as directed 15 mLs in the mouth or throat daily as needed (for thrush).    FLUCONAZOLE (DIFLUCAN) 100 MG TABLET    Take 100 mg by mouth daily as needed (for yeast infection).   FUROSEMIDE (LASIX) 40 MG TABLET    Take 40 mg by mouth. 2 by mouth 2 times daily Total 160 mg   GLIPIZIDE (GLUCOTROL XL) 5 MG 24 HR TABLET    Take 1 tablet (5 mg total) by mouth daily.   LACTULOSE (CHRONULAC) 10 GM/15ML SOLUTION    Take 20 g by mouth  daily as needed for mild constipation.   LISINOPRIL (PRINIVIL,ZESTRIL) 40 MG TABLET    Take 40 mg by mouth daily.   METFORMIN (GLUCOPHAGE) 1000 MG TABLET    Take one tablet twice a day for diabetes 250.00   NICOTINE (NICODERM CQ - DOSED IN MG/24 HOURS) 21 MG/24HR PATCH    Place 1 patch (21 mg total) onto the skin daily.   ONDANSETRON (ZOFRAN ODT) 4 MG DISINTEGRATING TABLET    Take 1 tablet (4 mg total) by mouth every 8 (eight) hours as needed for nausea or vomiting.   OXYCODONE HCL 10 MG TABS  Take one tablet up to 6 times as needed to control pain   VILAZODONE HCL (VIIBRYD) 40 MG TABS    Take 40 mg by mouth daily.  Modified Medications   No medications on file  Discontinued Medications   FUROSEMIDE (LASIX) 20 MG TABLET    Take 3 tablets (60 mg total) by mouth 2 (two) times daily.   LISINOPRIL (PRINIVIL,ZESTRIL) 5 MG TABLET    Take 3 tablets (15 mg total) by mouth at bedtime.   PROMETHAZINE (PHENERGAN) 12.5 MG TABLET    Take 12.5 mg by mouth every 6 (six) hours as needed for nausea or vomiting.     Physical Exam:  Filed Vitals:   08/07/13 0958  BP: 128/70  Pulse: 93  Resp: 12  Weight: 209 lb (94.802 kg)  SpO2: 95%    Physical Exam  Constitutional: She is well-developed, well-nourished, and in no distress.  HENT:  Head: Normocephalic and atraumatic.  Right Ear: External ear normal.  Left Ear: External ear normal.  Eyes: Conjunctivae and EOM are normal. Pupils are equal, round, and reactive to light.  Neck: Normal range of motion. Neck supple. No JVD present. No thyromegaly present.  Cardiovascular: Normal rate, regular rhythm and normal heart sounds.   Pulmonary/Chest: Effort normal and breath sounds normal. No respiratory distress.  Abdominal: Soft. Bowel sounds are normal. She exhibits no distension.  Abdomen with edema- improved  Musculoskeletal: She exhibits edema (still with pitting edema into thighs and abd; but has improved).  Lymphadenopathy:    She has no cervical  adenopathy.  Neurological: She is alert.  Skin: Skin is warm and dry. She is not diaphoretic.  Psychiatric: Affect normal.     Labs reviewed: Basic Metabolic Panel:  Recent Labs  16/10/96 1910  06/29/13 0900 06/30/13 0418 07/01/13 0540  NA  --   < > 138 137 141  K  --   < > 3.9 4.6 3.9  CL  --   < > 105 106 107  CO2  --   < > 26 23 28   GLUCOSE  --   < > 141* 142* 111*  BUN  --   < > 16 16 19   CREATININE  --   < > 0.68 0.60 0.80  CALCIUM  --   < > 8.4 8.5 8.4  PHOS  --   --  4.2 4.3 4.5  TSH 3.170  --   --   --   --   < > = values in this interval not displayed. Liver Function Tests:  Recent Labs  10/31/12 1144 06/19/13 0925  06/26/13 1335 06/29/13 0900 06/30/13 0418 07/01/13 0540  AST 13 12  --  16  --   --   --   ALT 22 6  --  9  --   --   --   ALKPHOS 87 94  --  91  --   --   --   BILITOT 0.1 <0.2  --  <0.1*  --   --   --   PROT 6.5 4.1*  --  5.3*  --   --   --   ALBUMIN  --   --   < > 0.8* 1.2* 0.9* 1.0*  < > = values in this interval not displayed. No results found for this basename: LIPASE, AMYLASE,  in the last 8760 hours No results found for this basename: AMMONIA,  in the last 8760 hours CBC:  Recent Labs  08/18/12 1041  10/31/12 1144  06/19/13 0925  06/28/13 0348 06/29/13 0900 07/01/13 0844  WBC 9.8  < > 9.5  < > 7.9  < > 7.6 6.2 5.8  NEUTROABS 5.8  --  5.3  --  3.8  --   --   --   --   HGB 14.0  --  14.1  < > 16.0*  < > 13.2 13.8 12.8  HCT 41.6  --  43.3  < > 46.9*  < > 39.1 40.8 39.0  MCV 80.9  --  83  < > 83  < > 82.8 80.6 82.8  PLT 261  --   --   < > 292  < > 242 266 258  < > = values in this interval not displayed. Lipid Panel:  Recent Labs  06/19/13 0925  HDL 37*  LDLCALC Comment  TRIG 429*  CHOLHDL 17.4*   TSH:  Recent Labs  06/26/13 1910  TSH 3.170   A1C: Lab Results  Component Value Date   HGBA1C 9.1* 06/19/2013    Lab Results  Component Value Date   PROTEIN24HR 98921* 06/26/2013      Assessment/Plan 1.  Hemorrhoid -educated on managing constipation to prevent worsening of hemorrhoids  - hydrocortisone (PROCTOSOL HC) 2.5 % rectal cream; Place 1 application rectally 2 (two) times daily as needed for hemorrhoids or itching.  Dispense: 30 g; Refill: 0  2. Other and unspecified hyperlipidemia -diet modifications encouraged - atorvastatin (LIPITOR) 40 MG tablet; Take 1 tablet (40 mg total) by mouth daily at 6 PM.  Dispense: 30 tablet; Refill: 5  3. Hypertension -Patient is stable; continue current regimen. Will monitor and make changes as necessary.  4. Crohn's disease -stable  5. Unspecified gingival and periodontal disease - chlorhexidine (PERIDEX) 0.12 % solution; Use as directed 15 mLs in the mouth or throat daily as needed (for thrush).  Dispense: 120 mL; Refill: 1  6. Depressed -mood has been very good; conts on viibryd   7. SOB (shortness of breath) -stable; conts lasix  8. Edema -still with pitting edema into abdomen; nephrology managing lasix; overall is better -will order hospital bed for better management of elevation in legs  9. Tobacco abuse -has cut back -encouraged to wait 30 mins after craving to cut back more -plans to start nicotine patch  10. Type II or unspecified type diabetes mellitus with neurological manifestations, uncontrolled(250.62) -does not have blood sugar logs today  -reports she is eating better; 3 meals a day, no hypoglycemic episodes -has cut out her sugary drinks  11. Focal segmental glomerulosclerosis -managed by nephrology; following up tomorrow   Keep follow up with Dr Chilton Si

## 2013-08-07 NOTE — Patient Instructions (Signed)
Have kidney doctor send [place] your blood work Keep up the good work!  Keep follow up with Dr Chilton Si

## 2013-08-18 ENCOUNTER — Other Ambulatory Visit: Payer: Self-pay | Admitting: *Deleted

## 2013-08-18 MED ORDER — OXYCODONE HCL 10 MG PO TABS: ORAL_TABLET | ORAL | Status: DC

## 2013-08-18 MED ORDER — ALPRAZOLAM 1 MG PO TABS
1.0000 mg | ORAL_TABLET | Freq: Three times a day (TID) | ORAL | Status: DC | PRN
Start: 2013-08-18 — End: 2013-09-15

## 2013-08-26 ENCOUNTER — Other Ambulatory Visit (HOSPITAL_COMMUNITY): Payer: Self-pay | Admitting: *Deleted

## 2013-08-28 ENCOUNTER — Encounter (HOSPITAL_COMMUNITY)
Admission: RE | Admit: 2013-08-28 | Discharge: 2013-08-28 | Disposition: A | Discharge status: Home / Self Care | Payer: Medicare Other | Source: Ambulatory Visit | Attending: Nephrology | Admitting: Nephrology

## 2013-08-28 DIAGNOSIS — F3289 Other specified depressive episodes: Secondary | ICD-10-CM | POA: Insufficient documentation

## 2013-08-28 DIAGNOSIS — K5090 Crohn's disease, unspecified, without complications: Secondary | ICD-10-CM | POA: Insufficient documentation

## 2013-08-28 DIAGNOSIS — F329 Major depressive disorder, single episode, unspecified: Secondary | ICD-10-CM | POA: Insufficient documentation

## 2013-08-28 DIAGNOSIS — K589 Irritable bowel syndrome without diarrhea: Secondary | ICD-10-CM | POA: Insufficient documentation

## 2013-08-28 DIAGNOSIS — K219 Gastro-esophageal reflux disease without esophagitis: Secondary | ICD-10-CM | POA: Insufficient documentation

## 2013-08-28 MED ORDER — SODIUM CHLORIDE 0.9 % IV SOLN
1000.0000 mg | INTRAVENOUS | Status: DC
  Administered 2013-08-28: 1000 mg via INTRAVENOUS
  Filled 2013-08-28: qty 8

## 2013-08-28 MED ORDER — SODIUM CHLORIDE 0.9 % IV SOLN: Freq: Every day | INTRAVENOUS | Status: DC

## 2013-08-29 ENCOUNTER — Encounter (HOSPITAL_COMMUNITY)
Admission: RE | Admit: 2013-08-29 | Discharge: 2013-08-29 | Disposition: A | Discharge status: Home / Self Care | Payer: Medicare Other | Source: Ambulatory Visit | Attending: Nephrology | Admitting: Nephrology

## 2013-08-29 MED ORDER — SODIUM CHLORIDE 0.9 % IV SOLN
1000.0000 mg | INTRAVENOUS | Status: DC
  Administered 2013-08-29: 1000 mg via INTRAVENOUS
  Filled 2013-08-29 (×2): qty 8

## 2013-08-29 MED ORDER — SODIUM CHLORIDE 0.9 % IV SOLN
Freq: Every day | INTRAVENOUS | Status: DC
  Administered 2013-08-29: 11:00:00 via INTRAVENOUS

## 2013-09-03 ENCOUNTER — Encounter: Payer: Self-pay | Admitting: Internal Medicine

## 2013-09-03 ENCOUNTER — Ambulatory Visit (INDEPENDENT_AMBULATORY_CARE_PROVIDER_SITE_OTHER): Payer: Medicare Other | Admitting: Internal Medicine

## 2013-09-03 VITALS — BP 118/72 | HR 100 | Ht 61.0 in | Wt 216.0 lb

## 2013-09-03 DIAGNOSIS — I10 Essential (primary) hypertension: Secondary | ICD-10-CM

## 2013-09-03 DIAGNOSIS — M25559 Pain in unspecified hip: Secondary | ICD-10-CM

## 2013-09-03 DIAGNOSIS — K5900 Constipation, unspecified: Secondary | ICD-10-CM

## 2013-09-03 DIAGNOSIS — F17200 Nicotine dependence, unspecified, uncomplicated: Secondary | ICD-10-CM

## 2013-09-03 DIAGNOSIS — E1149 Type 2 diabetes mellitus with other diabetic neurological complication: Secondary | ICD-10-CM

## 2013-09-03 DIAGNOSIS — R0602 Shortness of breath: Secondary | ICD-10-CM

## 2013-09-03 DIAGNOSIS — R609 Edema, unspecified: Secondary | ICD-10-CM

## 2013-09-03 DIAGNOSIS — N051 Unspecified nephritic syndrome with focal and segmental glomerular lesions: Secondary | ICD-10-CM

## 2013-09-03 DIAGNOSIS — N032 Chronic nephritic syndrome with diffuse membranous glomerulonephritis: Secondary | ICD-10-CM

## 2013-09-03 DIAGNOSIS — Z720 Tobacco use: Secondary | ICD-10-CM

## 2013-09-03 DIAGNOSIS — K5090 Crohn's disease, unspecified, without complications: Secondary | ICD-10-CM

## 2013-09-03 DIAGNOSIS — E785 Hyperlipidemia, unspecified: Secondary | ICD-10-CM | POA: Insufficient documentation

## 2013-09-03 DIAGNOSIS — N052 Unspecified nephritic syndrome with diffuse membranous glomerulonephritis: Secondary | ICD-10-CM

## 2013-09-03 MED ORDER — POLYETHYLENE GLYCOL 3350 17 GM/SCOOP PO POWD: ORAL | Status: AC

## 2013-09-03 NOTE — Progress Notes (Signed)
Patient ID: [name] [name], female   DOB: [birth year], 53 y.o.   MRN: [id number]    Location:    [facility]  Place of Service:   OFFICE    Allergies  Allergen Reactions  . Avelox [Moxifloxacin Hcl In Nacl] Swelling  . Chantix [Varenicline] Nausea Only  . Imipramine Hcl Swelling  . Tetracyclines & Related Swelling    Chief Complaint  Patient presents with  . 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 Scheduled for treatment with prednisone and cyclophosphamide under the care of nephrologist, Dr. Marisue Humble.  Edema: tender, tense edema of both legs. Abdomen swollen.  Hypertension: controlled  Tobacco abuse: continues to smoke. Did not get nicotine patches prescribed at the end of her hospitalization in Dec 2014.  Crohn's disease: in remission  SOB (shortness of breath): improved since she was diuresed.  Hip pain: still present above and at the right hip. Improved since prior visits.  Hyperlipidemia: uncontrolled. Has had increase in atorvastatin recently.  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 Scheduled for treatment with prednisone and cyclophosphamide  Unspecified constipation: benefits from Miralax    Medications: Patient's Medications  New Prescriptions   No medications on file  Previous Medications   ALBUTEROL (PROVENTIL) (2.5 MG/3ML) 0.083% NEBULIZER SOLUTION    Take 2.5 mg by nebulization every 6 (six) hours as needed for shortness of breath.    ALBUTEROL-IPRATROPIUM (COMBIVENT) 18-103 MCG/ACT INHALER    Inhale 2 puffs into the lungs every 6 (six) hours as needed for wheezing or shortness of breath.   ALPRAZOLAM (XANAX) 1 MG TABLET    Take 1 tablet (1 mg  total) by mouth 3 (three) times daily as needed for anxiety.   ATORVASTATIN (LIPITOR) 40 MG TABLET    Take 1 tablet (40 mg total) by mouth daily at 6 PM.   CHLORHEXIDINE (PERIDEX) 0.12 % SOLUTION    Use as directed 15 mLs in the mouth or throat daily as needed (for thrush).   CHLORTHALIDONE (HYGROTON) 25 MG TABLET    Take one daily   FLUCONAZOLE (DIFLUCAN) 100 MG TABLET    Take 100 mg by mouth daily as needed (for yeast infection).   FUROSEMIDE (LASIX) 40 MG TABLET    Take 40 mg by mouth. 2 by mouth 2 times daily Total 160 mg   GLIPIZIDE (GLUCOTROL XL) 5 MG 24 HR TABLET    Take 1 tablet (5 mg total) by mouth daily.   HYDROCORTISONE (PROCTOSOL HC) 2.5 % RECTAL CREAM    Place 1 application rectally 2 (two) times daily as needed for hemorrhoids or itching.   LACTULOSE (CHRONULAC) 10 GM/15ML SOLUTION    Take 20 g by mouth daily as needed for mild constipation.   LISINOPRIL (PRINIVIL,ZESTRIL) 40 MG TABLET    Take 40 mg by mouth daily.   METFORMIN (GLUCOPHAGE) 1000 MG TABLET    Take one tablet twice a day for diabetes 250.00   NICOTINE (NICODERM CQ - DOSED IN MG/24 HOURS) 21 MG/24HR PATCH    Place 1 patch (21 mg total) onto the skin daily.   ONDANSETRON (ZOFRAN ODT) 4 MG DISINTEGRATING TABLET    Take 1 tablet (4 mg total) by mouth every 8 (eight) hours as needed for nausea or vomiting.  OXYCODONE HCL 10 MG TABS    Take one tablet up to 6 times as needed to control pain   PREDNISONE (DELTASONE) 20 MG TABLET    Take 2 tablets daily   VILAZODONE HCL (VIIBRYD) 40 MG TABS    Take 40 mg by mouth daily.  Modified Medications   No medications on file  Discontinued Medications   No medications on file     Review of Systems  Constitutional: Positive for activity change and fatigue. Negative for fever, chills, diaphoresis, appetite change and unexpected weight change.  HENT: Positive for congestion. Negative for ear pain, hearing loss, sinus pressure and sore throat.   Eyes: Negative.   Respiratory:  Positive for cough, chest tightness and shortness of breath. Negative for wheezing and stridor.   Cardiovascular: Positive for leg swelling. Negative for chest pain and palpitations.  Gastrointestinal: Negative for nausea, vomiting, abdominal pain, diarrhea, constipation and abdominal distention.  Endocrine:       Hx DM  Genitourinary:       Membranous glomerulopathy dx Dec 2014.  Musculoskeletal: Positive for back pain and gait problem (using walker).  Skin: Negative.   Allergic/Immunologic: Negative.   Neurological:       Numb feet  Hematological: Negative.   Psychiatric/Behavioral: Positive for dysphoric mood. The patient is nervous/anxious.     Filed Vitals:   09/03/13 1419  BP: 118/72  Pulse: 100  Height: 5\' 1"  (1.549 m)  Weight: 216 lb (97.977 kg)  SpO2: 98%   Physical Exam  Constitutional: She is oriented to person, place, and time.  obese  HENT:  Head: Normocephalic and atraumatic.  Right Ear: External ear normal.  Left Ear: External ear normal.  Nose: Nose normal.  Mouth/Throat: Oropharynx is clear and moist.  Eyes: Conjunctivae and EOM are normal. Pupils are equal, round, and reactive to light.  Neck: No JVD present. No tracheal deviation present. No thyromegaly present.  Cardiovascular: Normal rate, regular rhythm, normal heart sounds and intact distal pulses.  Exam reveals no gallop and no friction rub.   No murmur heard. Pulmonary/Chest: No respiratory distress. She has no wheezes. She has no rales. She exhibits no tenderness.  Abdominal: She exhibits no distension and no mass. There is no tenderness.  Musculoskeletal: Normal range of motion. She exhibits edema. She exhibits no tenderness.  Unstable gait. Using 4 wheel walker.  Lymphadenopathy:    She has no cervical adenopathy.  Neurological: She is alert and oriented to person, place, and time. No cranial nerve deficit. Coordination normal.  Skin: No rash noted. No erythema. No pallor.  Psychiatric: Her  behavior is normal. Judgment and thought content normal.  Still depressed     Labs reviewed: Office Visit on 09/03/2013  Component Date Value Ref Range Status  . Glucose 07/31/2013 110   Final  . BUN 07/31/2013 12  4 - 21 mg/dL Final  . Creatinine 16/10/960401/15/2015 0.7  0.5 - 1.1 mg/dL Final  . Potassium 54/09/811901/15/2015 3.7  3.4 - 5.3 mmol/L Final  . Sodium 07/31/2013 136* 137 - 147 mmol/L Final  . Triglycerides 07/31/2013 274* 40 - 160 mg/dL Final  . Cholesterol 14/78/295601/15/2015 407* 0 - 200 mg/dL Final  . HDL 21/30/865701/15/2015 43  35 - 70 mg/dL Final  . LDL Cholesterol 07/31/2013 309   Final  Admission on 06/26/2013, Discharged on 07/01/2013  No results displayed because visit has over 200 results.    Office Visit on 06/26/2013  Component Date Value Ref Range Status  . [facility] Colonoscopy  09/10/2006 No Crohn's Disease    Final  Appointment on 06/19/2013  Component Date Value Ref Range Status  . WBC 06/19/2013 7.9  3.4 - 10.8 x10E3/uL Final  . RBC 06/19/2013 5.67* 3.77 - 5.28 x10E6/uL Final  . Hemoglobin 06/19/2013 16.0* 11.1 - 15.9 g/dL Final  . HCT 09/81/1914 46.9* 34.0 - 46.6 % Final  . MCV 06/19/2013 83  79 - 97 fL Final  . MCH 06/19/2013 28.2  26.6 - 33.0 pg Final  . MCHC 06/19/2013 34.1  31.5 - 35.7 g/dL Final  . RDW 78/29/5621 15.3  12.3 - 15.4 % Final  . Platelets 06/19/2013 292  150 - 379 x10E3/uL Final  . Neutrophils Relative % 06/19/2013 48   Final  . Lymphs 06/19/2013 41   Final  . Monocytes 06/19/2013 6   Final  . Eos 06/19/2013 5   Final  . Basos 06/19/2013 0   Final  . Neutrophils Absolute 06/19/2013 3.8  1.4 - 7.0 x10E3/uL Final  . Lymphocytes Absolute 06/19/2013 3.2* 0.7 - 3.1 x10E3/uL Final  . Monocytes Absolute 06/19/2013 0.5  0.1 - 0.9 x10E3/uL Final  . Eosinophils Absolute 06/19/2013 0.4  0.0 - 0.4 x10E3/uL Final  . Basophils Absolute 06/19/2013 0.0  0.0 - 0.2 x10E3/uL Final  . Immature Granulocytes 06/19/2013 0   Final  . Immature Grans (Abs) 06/19/2013 0.0  0.0 - 0.1 x10E3/uL  Final  . Hemoglobin A1C 06/19/2013 9.1* 4.8 - 5.6 % Final   Comment:          Increased risk for diabetes: 5.7 - 6.4                                   Diabetes: >6.4                                   Glycemic control for adults with diabetes: <7.0  . Estimated average glucose 06/19/2013 214   Final  . Cholesterol, Total 06/19/2013 645* 100 - 199 mg/dL Final  . Triglycerides 06/19/2013 429* 0 - 149 mg/dL Final  . HDL 30/86/5784 37* >39 mg/dL Final   Comment: According to ATP-III Guidelines, HDL-C >59 mg/dL is considered a                          negative risk factor for CHD.  Marland Kitchen VLDL Cholesterol Cal 06/19/2013 Comment  5 - 40 mg/dL Final   Comment: The calculation for the VLDL cholesterol is not valid when                          triglyceride level is >400 mg/dL.  Marland Kitchen LDL Calculated 06/19/2013 Comment  0 - 99 mg/dL Final   Comment: Triglyceride result indicated is too high for an accurate LDL                          cholesterol estimation.  . Chol/HDL Ratio 06/19/2013 17.4* 0.0 - 4.4 ratio units Final   Comment:                                   T. Chol/HDL Ratio  Men  Women                                                        1/2 Avg.Risk  3.4    3.3                                                            Avg.Risk  5.0    4.4                                                         2X Avg.Risk  9.6    7.1                                                         3X Avg.Risk 23.4   11.0  . Glucose 06/19/2013 127* 65 - 99 mg/dL Final  . BUN 16/04/9603 14  6 - 24 mg/dL Final  . Creatinine, Ser 06/19/2013 0.48* 0.57 - 1.00 mg/dL Final  . GFR calc non Af Amer 06/19/2013 112  >59 mL/min/1.73 Final  . GFR calc Af Amer 06/19/2013 129  >59 mL/min/1.73 Final  . BUN/Creatinine Ratio 06/19/2013 29* 9 - 23 Final  . Sodium 06/19/2013 142  134 - 144 mmol/L Final  . Potassium 06/19/2013 4.9  3.5 - 5.2 mmol/L Final  . Chloride  06/19/2013 105  97 - 108 mmol/L Final  . CO2 06/19/2013 26  18 - 29 mmol/L Final  . Calcium 06/19/2013 8.3* 8.7 - 10.2 mg/dL Final  . Total Protein 06/19/2013 4.1* 6.0 - 8.5 g/dL Final  . Albumin 54/03/8118 1.7* 3.5 - 5.5 g/dL Final  . Globulin, Total 06/19/2013 2.4  1.5 - 4.5 g/dL Final  . Albumin/Globulin Ratio 06/19/2013 0.7* 1.1 - 2.5 Final  . Total Bilirubin 06/19/2013 <0.2  0.0 - 1.2 mg/dL Final  . Alkaline Phosphatase 06/19/2013 94  39 - 117 IU/L Final  . AST 06/19/2013 12  0 - 40 IU/L Final  . ALT 06/19/2013 6  0 - 32 IU/L Final      Assessment/Plan  1. Type II or unspecified type diabetes mellitus with neurological manifestations, uncontrolled(250.62) High risk for worsening due to prednisone.  2. Idiopathic membranous glomerulopathy Continue with Dr Marisue Humble  3. Edema Unchanged. Etiology is the nephrotic syndrome and glomerular nephropathy  4. Hypertension controlled  5. Tobacco abuse Advised to stop  6. Crohn's disease In remission  7. SOB (shortness of breath) Improved since Dec 2014, but still has problems with minimal exeretion  8. Hip pain Improved, but still using oxycodone at times  9. Hyperlipidemia Out of control. Recently increased atorvastatin.   10. Focal segmental glomerulosclerosis Continue to see Dr. Marisue Humble  11. Unspecified constipation Use Miralax

## 2013-09-03 NOTE — Patient Instructions (Addendum)
Take medications as listed 

## 2013-09-15 ENCOUNTER — Other Ambulatory Visit: Payer: Self-pay | Admitting: *Deleted

## 2013-09-15 MED ORDER — OXYCODONE HCL 10 MG PO TABS: ORAL_TABLET | ORAL | Status: DC

## 2013-09-15 MED ORDER — ALPRAZOLAM 1 MG PO TABS: 1.0000 mg | ORAL_TABLET | Freq: Three times a day (TID) | ORAL | Status: DC | PRN

## 2013-10-15 ENCOUNTER — Other Ambulatory Visit: Payer: Self-pay | Admitting: *Deleted

## 2013-10-15 MED ORDER — ALPRAZOLAM 1 MG PO TABS: 1.0000 mg | ORAL_TABLET | Freq: Three times a day (TID) | ORAL | Status: DC | PRN

## 2013-10-15 MED ORDER — OXYCODONE HCL 10 MG PO TABS: ORAL_TABLET | ORAL | Status: DC

## 2013-10-23 ENCOUNTER — Other Ambulatory Visit: Payer: Self-pay

## 2013-10-29 ENCOUNTER — Encounter (HOSPITAL_COMMUNITY)
Admission: RE | Admit: 2013-10-29 | Discharge: 2013-10-29 | Disposition: A | Discharge status: Home / Self Care | Payer: Medicare Other | Source: Ambulatory Visit | Attending: Nephrology | Admitting: Nephrology

## 2013-10-29 DIAGNOSIS — K589 Irritable bowel syndrome without diarrhea: Secondary | ICD-10-CM | POA: Insufficient documentation

## 2013-10-29 DIAGNOSIS — F3289 Other specified depressive episodes: Secondary | ICD-10-CM | POA: Insufficient documentation

## 2013-10-29 DIAGNOSIS — F329 Major depressive disorder, single episode, unspecified: Secondary | ICD-10-CM | POA: Insufficient documentation

## 2013-10-29 DIAGNOSIS — K219 Gastro-esophageal reflux disease without esophagitis: Secondary | ICD-10-CM | POA: Insufficient documentation

## 2013-10-29 DIAGNOSIS — K5090 Crohn's disease, unspecified, without complications: Secondary | ICD-10-CM | POA: Insufficient documentation

## 2013-10-29 MED ORDER — SODIUM CHLORIDE 0.9 % IV SOLN
1000.0000 mg | Freq: Every day | INTRAVENOUS | Status: DC
  Administered 2013-10-29: 1000 mg via INTRAVENOUS
  Filled 2013-10-29: qty 8

## 2013-10-30 ENCOUNTER — Encounter (HOSPITAL_COMMUNITY)
Admission: RE | Admit: 2013-10-30 | Discharge: 2013-10-30 | Disposition: A | Discharge status: Home / Self Care | Payer: Medicare Other | Source: Ambulatory Visit | Attending: Nephrology | Admitting: Nephrology

## 2013-10-30 MED ORDER — SODIUM CHLORIDE 0.9 % IV SOLN
1000.0000 mg | Freq: Every day | INTRAVENOUS | Status: DC
  Administered 2013-10-30: 1000 mg via INTRAVENOUS
  Filled 2013-10-30: qty 8

## 2013-11-04 ENCOUNTER — Other Ambulatory Visit: Payer: Self-pay | Admitting: Internal Medicine

## 2013-11-13 ENCOUNTER — Other Ambulatory Visit: Payer: Self-pay

## 2013-11-13 DIAGNOSIS — IMO0002 Reserved for concepts with insufficient information to code with codable children: Secondary | ICD-10-CM

## 2013-11-13 DIAGNOSIS — E1165 Type 2 diabetes mellitus with hyperglycemia: Principal | ICD-10-CM

## 2013-11-13 DIAGNOSIS — E1149 Type 2 diabetes mellitus with other diabetic neurological complication: Secondary | ICD-10-CM

## 2013-11-13 MED ORDER — GLIPIZIDE ER 5 MG PO TB24: 5.0000 mg | ORAL_TABLET | Freq: Every day | ORAL | Status: AC

## 2013-11-13 MED ORDER — OXYCODONE HCL 10 MG PO TABS: ORAL_TABLET | ORAL | Status: AC

## 2013-11-13 MED ORDER — ALPRAZOLAM 1 MG PO TABS: 1.0000 mg | ORAL_TABLET | Freq: Three times a day (TID) | ORAL | Status: AC | PRN

## 2013-11-13 NOTE — Telephone Encounter (Signed)
Patient would like rx's filled today, our office will be closed tomorrow when patient leaves her Chemo treatment

## 2013-11-13 NOTE — Telephone Encounter (Signed)
Patient aware rx available for pick-up  

## 2013-11-30 ENCOUNTER — Emergency Department (HOSPITAL_COMMUNITY): Payer: Medicare Other

## 2013-11-30 ENCOUNTER — Encounter (HOSPITAL_COMMUNITY): Admission: EM | Disposition: E | Payer: Medicare Other | Source: Home / Self Care | Attending: Cardiovascular Disease

## 2013-11-30 ENCOUNTER — Encounter (HOSPITAL_COMMUNITY): Payer: Self-pay | Admitting: Anesthesiology

## 2013-11-30 ENCOUNTER — Encounter (HOSPITAL_COMMUNITY): Payer: Self-pay | Admitting: Emergency Medicine

## 2013-11-30 ENCOUNTER — Encounter (HOSPITAL_COMMUNITY): Payer: Medicare Other | Admitting: Anesthesiology

## 2013-11-30 ENCOUNTER — Inpatient Hospital Stay (HOSPITAL_COMMUNITY): Payer: Medicare Other

## 2013-11-30 ENCOUNTER — Inpatient Hospital Stay (HOSPITAL_COMMUNITY)
Admission: EM | Admit: 2013-11-30 | Discharge: 2013-12-15 | DRG: 237 | Disposition: E | Payer: Medicare Other | Attending: Cardiovascular Disease | Admitting: Cardiovascular Disease

## 2013-11-30 DIAGNOSIS — I10 Essential (primary) hypertension: Secondary | ICD-10-CM | POA: Diagnosis present

## 2013-11-30 DIAGNOSIS — E1142 Type 2 diabetes mellitus with diabetic polyneuropathy: Secondary | ICD-10-CM | POA: Diagnosis present

## 2013-11-30 DIAGNOSIS — E8729 Other acidosis: Secondary | ICD-10-CM | POA: Diagnosis present

## 2013-11-30 DIAGNOSIS — I129 Hypertensive chronic kidney disease with stage 1 through stage 4 chronic kidney disease, or unspecified chronic kidney disease: Secondary | ICD-10-CM | POA: Diagnosis present

## 2013-11-30 DIAGNOSIS — M1990 Unspecified osteoarthritis, unspecified site: Secondary | ICD-10-CM | POA: Diagnosis present

## 2013-11-30 DIAGNOSIS — J449 Chronic obstructive pulmonary disease, unspecified: Secondary | ICD-10-CM | POA: Diagnosis present

## 2013-11-30 DIAGNOSIS — I4510 Unspecified right bundle-branch block: Secondary | ICD-10-CM | POA: Diagnosis present

## 2013-11-30 DIAGNOSIS — I2510 Atherosclerotic heart disease of native coronary artery without angina pectoris: Principal | ICD-10-CM | POA: Diagnosis present

## 2013-11-30 DIAGNOSIS — I498 Other specified cardiac arrhythmias: Secondary | ICD-10-CM | POA: Diagnosis present

## 2013-11-30 DIAGNOSIS — I5021 Acute systolic (congestive) heart failure: Secondary | ICD-10-CM | POA: Diagnosis present

## 2013-11-30 DIAGNOSIS — Z720 Tobacco use: Secondary | ICD-10-CM | POA: Diagnosis present

## 2013-11-30 DIAGNOSIS — R570 Cardiogenic shock: Secondary | ICD-10-CM | POA: Diagnosis present

## 2013-11-30 DIAGNOSIS — F3289 Other specified depressive episodes: Secondary | ICD-10-CM | POA: Diagnosis present

## 2013-11-30 DIAGNOSIS — F411 Generalized anxiety disorder: Secondary | ICD-10-CM | POA: Diagnosis present

## 2013-11-30 DIAGNOSIS — F329 Major depressive disorder, single episode, unspecified: Secondary | ICD-10-CM | POA: Diagnosis present

## 2013-11-30 DIAGNOSIS — I2109 ST elevation (STEMI) myocardial infarction involving other coronary artery of anterior wall: Secondary | ICD-10-CM | POA: Diagnosis present

## 2013-11-30 DIAGNOSIS — E872 Acidosis, unspecified: Secondary | ICD-10-CM | POA: Diagnosis present

## 2013-11-30 DIAGNOSIS — I959 Hypotension, unspecified: Secondary | ICD-10-CM

## 2013-11-30 DIAGNOSIS — I213 ST elevation (STEMI) myocardial infarction of unspecified site: Secondary | ICD-10-CM | POA: Diagnosis present

## 2013-11-30 DIAGNOSIS — G8100 Flaccid hemiplegia affecting unspecified side: Secondary | ICD-10-CM | POA: Diagnosis present

## 2013-11-30 DIAGNOSIS — E039 Hypothyroidism, unspecified: Secondary | ICD-10-CM | POA: Diagnosis present

## 2013-11-30 DIAGNOSIS — N051 Unspecified nephritic syndrome with focal and segmental glomerular lesions: Secondary | ICD-10-CM | POA: Diagnosis present

## 2013-11-30 DIAGNOSIS — E2749 Other adrenocortical insufficiency: Secondary | ICD-10-CM | POA: Diagnosis present

## 2013-11-30 DIAGNOSIS — K219 Gastro-esophageal reflux disease without esophagitis: Secondary | ICD-10-CM | POA: Diagnosis present

## 2013-11-30 DIAGNOSIS — N052 Unspecified nephritic syndrome with diffuse membranous glomerulonephritis: Secondary | ICD-10-CM | POA: Diagnosis present

## 2013-11-30 DIAGNOSIS — N049 Nephrotic syndrome with unspecified morphologic changes: Secondary | ICD-10-CM | POA: Diagnosis present

## 2013-11-30 DIAGNOSIS — F17200 Nicotine dependence, unspecified, uncomplicated: Secondary | ICD-10-CM | POA: Diagnosis present

## 2013-11-30 DIAGNOSIS — I469 Cardiac arrest, cause unspecified: Secondary | ICD-10-CM | POA: Diagnosis present

## 2013-11-30 DIAGNOSIS — I2582 Chronic total occlusion of coronary artery: Secondary | ICD-10-CM | POA: Diagnosis present

## 2013-11-30 DIAGNOSIS — N032 Chronic nephritic syndrome with diffuse membranous glomerulonephritis: Secondary | ICD-10-CM | POA: Diagnosis present

## 2013-11-30 DIAGNOSIS — N189 Chronic kidney disease, unspecified: Secondary | ICD-10-CM | POA: Diagnosis present

## 2013-11-30 DIAGNOSIS — E785 Hyperlipidemia, unspecified: Secondary | ICD-10-CM | POA: Diagnosis present

## 2013-11-30 DIAGNOSIS — N042 Nephrotic syndrome with diffuse membranous glomerulonephritis, unspecified: Secondary | ICD-10-CM | POA: Diagnosis present

## 2013-11-30 DIAGNOSIS — K5010 Crohn's disease of large intestine without complications: Secondary | ICD-10-CM | POA: Diagnosis present

## 2013-11-30 DIAGNOSIS — R569 Unspecified convulsions: Secondary | ICD-10-CM | POA: Diagnosis present

## 2013-11-30 DIAGNOSIS — E876 Hypokalemia: Secondary | ICD-10-CM | POA: Diagnosis present

## 2013-11-30 DIAGNOSIS — E1149 Type 2 diabetes mellitus with other diabetic neurological complication: Secondary | ICD-10-CM | POA: Diagnosis present

## 2013-11-30 DIAGNOSIS — J4489 Other specified chronic obstructive pulmonary disease: Secondary | ICD-10-CM | POA: Diagnosis present

## 2013-11-30 DIAGNOSIS — I4901 Ventricular fibrillation: Secondary | ICD-10-CM | POA: Diagnosis present

## 2013-11-30 DIAGNOSIS — J9600 Acute respiratory failure, unspecified whether with hypoxia or hypercapnia: Secondary | ICD-10-CM

## 2013-11-30 DIAGNOSIS — IMO0002 Reserved for concepts with insufficient information to code with codable children: Secondary | ICD-10-CM

## 2013-11-30 HISTORY — PX: LEFT HEART CATH: SHX5478

## 2013-11-30 LAB — POCT I-STAT 3, ART BLOOD GAS (G3+)
ACID-BASE DEFICIT: 20 mmol/L — AB (ref 0.0–2.0)
ACID-BASE DEFICIT: 10 mmol/L — AB (ref 0.0–2.0)
BICARBONATE: 8.7 meq/L — AB (ref 20.0–24.0)
Bicarbonate: 17 mEq/L — ABNORMAL LOW (ref 20.0–24.0)
O2 SAT: 96 %
O2 Saturation: 99 %
PO2 ART: 185 mmHg — AB (ref 80.0–100.0)
TCO2: 10 mmol/L (ref 0–100)
TCO2: 18 mmol/L (ref 0–100)
pCO2 arterial: 32.1 mmHg — ABNORMAL LOW (ref 35.0–45.0)
pCO2 arterial: 43.5 mmHg (ref 35.0–45.0)
pH, Arterial: 7.039 — CL (ref 7.350–7.450)
pH, Arterial: 7.199 — CL (ref 7.350–7.450)
pO2, Arterial: 100 mmHg (ref 80.0–100.0)

## 2013-11-30 LAB — POCT I-STAT, CHEM 8
BUN: 16 mg/dL (ref 6–23)
CALCIUM ION: 1.14 mmol/L (ref 1.12–1.23)
CHLORIDE: 105 meq/L (ref 96–112)
Creatinine, Ser: 1 mg/dL (ref 0.50–1.10)
GLUCOSE: 579 mg/dL — AB (ref 70–99)
HCT: 17 % — ABNORMAL LOW (ref 36.0–46.0)
Hemoglobin: 5.8 g/dL — CL (ref 12.0–15.0)
Potassium: 4.5 mEq/L (ref 3.7–5.3)
Sodium: 134 mEq/L — ABNORMAL LOW (ref 137–147)
TCO2: 10 mmol/L (ref 0–100)

## 2013-11-30 LAB — APTT: aPTT: 88 seconds — ABNORMAL HIGH (ref 24–37)

## 2013-11-30 LAB — CBC
HCT: 28.8 % — ABNORMAL LOW (ref 36.0–46.0)
HEMOGLOBIN: 9.1 g/dL — AB (ref 12.0–15.0)
MCH: 28.4 pg (ref 26.0–34.0)
MCHC: 31.6 g/dL (ref 30.0–36.0)
MCV: 90 fL (ref 78.0–100.0)
Platelets: 301 10*3/uL (ref 150–400)
RBC: 3.2 MIL/uL — AB (ref 3.87–5.11)
RDW: 17.4 % — ABNORMAL HIGH (ref 11.5–15.5)
WBC: 11 10*3/uL — AB (ref 4.0–10.5)

## 2013-11-30 LAB — CBC WITH DIFFERENTIAL/PLATELET
Basophils Absolute: 0.1 10*3/uL (ref 0.0–0.1)
Basophils Relative: 1 % (ref 0–1)
Eosinophils Absolute: 0.1 10*3/uL (ref 0.0–0.7)
Eosinophils Relative: 1 % (ref 0–5)
HEMATOCRIT: 27.8 % — AB (ref 36.0–46.0)
HEMOGLOBIN: 9.1 g/dL — AB (ref 12.0–15.0)
LYMPHS PCT: 25 % (ref 12–46)
Lymphs Abs: 3.1 10*3/uL (ref 0.7–4.0)
MCH: 28.3 pg (ref 26.0–34.0)
MCHC: 32.7 g/dL (ref 30.0–36.0)
MCV: 86.6 fL (ref 78.0–100.0)
MONOS PCT: 3 % (ref 3–12)
Monocytes Absolute: 0.4 10*3/uL (ref 0.1–1.0)
NEUTROS ABS: 8.7 10*3/uL — AB (ref 1.7–7.7)
Neutrophils Relative %: 70 % (ref 43–77)
Platelets: 332 10*3/uL (ref 150–400)
RBC: 3.21 MIL/uL — ABNORMAL LOW (ref 3.87–5.11)
RDW: 17 % — AB (ref 11.5–15.5)
WBC: 12.4 10*3/uL — AB (ref 4.0–10.5)

## 2013-11-30 LAB — COMPREHENSIVE METABOLIC PANEL
ALBUMIN: 1.2 g/dL — AB (ref 3.5–5.2)
ALK PHOS: 113 U/L (ref 39–117)
ALT: 110 U/L — ABNORMAL HIGH (ref 0–35)
AST: 260 U/L — AB (ref 0–37)
BUN: 19 mg/dL (ref 6–23)
CO2: 15 mEq/L — ABNORMAL LOW (ref 19–32)
Calcium: 7.7 mg/dL — ABNORMAL LOW (ref 8.4–10.5)
Chloride: 99 mEq/L (ref 96–112)
Creatinine, Ser: 0.93 mg/dL (ref 0.50–1.10)
GFR calc Af Amer: 79 mL/min — ABNORMAL LOW (ref >90)
GFR calc non Af Amer: 68 mL/min — ABNORMAL LOW (ref >90)
GLUCOSE: 633 mg/dL — AB (ref 70–99)
POTASSIUM: 4 meq/L (ref 3.7–5.3)
Sodium: 139 mEq/L (ref 137–147)
Total Bilirubin: 0.4 mg/dL (ref 0.3–1.2)
Total Protein: 4.3 g/dL — ABNORMAL LOW (ref 6.0–8.3)

## 2013-11-30 LAB — GLUCOSE, CAPILLARY
GLUCOSE-CAPILLARY: 482 mg/dL — AB (ref 70–99)
GLUCOSE-CAPILLARY: 518 mg/dL — AB (ref 70–99)
Glucose-Capillary: 554 mg/dL (ref 70–99)
Glucose-Capillary: 460 mg/dL — ABNORMAL HIGH (ref 70–99)

## 2013-11-30 LAB — CG4 I-STAT (LACTIC ACID): Lactic Acid, Venous: 14.74 mmol/L — ABNORMAL HIGH (ref 0.5–2.2)

## 2013-11-30 LAB — POCT ACTIVATED CLOTTING TIME: Activated Clotting Time: 542 seconds

## 2013-11-30 LAB — I-STAT CHEM 8, ED
BUN: 17 mg/dL (ref 6–23)
CHLORIDE: 107 meq/L (ref 96–112)
Calcium, Ion: 1.8 mmol/L (ref 1.12–1.23)
Creatinine, Ser: 1.1 mg/dL (ref 0.50–1.10)
Glucose, Bld: 335 mg/dL — ABNORMAL HIGH (ref 70–99)
HEMATOCRIT: 25 % — AB (ref 36.0–46.0)
Hemoglobin: 8.5 g/dL — ABNORMAL LOW (ref 12.0–15.0)
POTASSIUM: 3.4 meq/L — AB (ref 3.7–5.3)
Sodium: 142 mEq/L (ref 137–147)
TCO2: 17 mmol/L (ref 0–100)

## 2013-11-30 LAB — TSH: TSH: 3.33 u[IU]/mL (ref 0.350–4.500)

## 2013-11-30 LAB — PROTIME-INR
INR: 1.8 — AB (ref 0.00–1.49)
Prothrombin Time: 20.4 seconds — ABNORMAL HIGH (ref 11.6–15.2)

## 2013-11-30 LAB — I-STAT TROPONIN, ED: Troponin i, poc: 0.37 ng/mL (ref 0.00–0.08)

## 2013-11-30 LAB — I-STAT CG4 LACTIC ACID, ED: Lactic Acid, Venous: 14.61 mmol/L — ABNORMAL HIGH (ref 0.5–2.2)

## 2013-11-30 LAB — MAGNESIUM: Magnesium: 3.1 mg/dL — ABNORMAL HIGH (ref 1.5–2.5)

## 2013-11-30 LAB — TROPONIN I: Troponin I: >20 ng/mL (ref <0.30)

## 2013-11-30 SURGERY — LEFT HEART CATH

## 2013-11-30 MED ORDER — AMIODARONE HCL 150 MG/3ML IV SOLN
INTRAVENOUS | Status: AC
  Filled 2013-11-30: qty 3

## 2013-11-30 MED ORDER — SODIUM BICARBONATE 8.4 % IV SOLN
INTRAVENOUS | Status: AC
  Filled 2013-11-30: qty 50

## 2013-11-30 MED ORDER — AMIODARONE LOAD VIA INFUSION: 150.0000 mg | Freq: Once | INTRAVENOUS | Status: DC

## 2013-11-30 MED ORDER — BIVALIRUDIN 250 MG IV SOLR
INTRAVENOUS | Status: AC
Start: 2013-11-30 — End: 2013-11-30
  Filled 2013-11-30: qty 250

## 2013-11-30 MED ORDER — HEPARIN (PORCINE) IN NACL 2-0.9 UNIT/ML-% IJ SOLN
INTRAMUSCULAR | Status: AC
  Filled 2013-11-30: qty 1000

## 2013-11-30 MED ORDER — SODIUM CHLORIDE 0.9 % IV SOLN
0.0000 mg/h | INTRAVENOUS | Status: DC
  Filled 2013-11-30 (×2): qty 10

## 2013-11-30 MED ORDER — MIDAZOLAM BOLUS VIA INFUSION
4.0000 mg | Freq: Once | INTRAVENOUS | Status: AC
Start: 2013-11-30 — End: 2013-11-30
  Administered 2013-11-30: 4 mg via INTRAVENOUS
  Filled 2013-11-30: qty 4

## 2013-11-30 MED ORDER — SODIUM CHLORIDE 0.9 % IV SOLN
0.0000 ug/h | INTRAVENOUS | Status: DC
  Filled 2013-11-30: qty 50

## 2013-11-30 MED ORDER — FENTANYL CITRATE 0.05 MG/ML IJ SOLN: 100.0000 ug | INTRAMUSCULAR | Status: DC | PRN

## 2013-11-30 MED ORDER — HEPARIN (PORCINE) IN NACL 2-0.9 UNIT/ML-% IJ SOLN
INTRAMUSCULAR | Status: AC
  Filled 2013-11-30: qty 500

## 2013-11-30 MED ORDER — LIDOCAINE IN D5W 4-5 MG/ML-% IV SOLN
INTRAVENOUS | Status: AC
  Filled 2013-11-30: qty 500

## 2013-11-30 MED ORDER — MIDAZOLAM HCL 2 MG/2ML IJ SOLN: 2.0000 mg | INTRAMUSCULAR | Status: DC | PRN

## 2013-11-30 MED ORDER — SODIUM CHLORIDE 0.9 % IV SOLN
INTRAVENOUS | Status: DC
  Administered 2013-11-30: 4.9 [IU]/h via INTRAVENOUS
  Filled 2013-11-30: qty 1

## 2013-11-30 MED ORDER — LIDOCAINE IN D5W 4-5 MG/ML-% IV SOLN
1.0000 mg/min | INTRAVENOUS | Status: DC
  Filled 2013-11-30: qty 250

## 2013-11-30 MED ORDER — FENTANYL CITRATE 0.05 MG/ML IJ SOLN
INTRAMUSCULAR | Status: AC
  Administered 2013-11-30: 100 ug
  Filled 2013-11-30: qty 2

## 2013-11-30 MED ORDER — ATORVASTATIN CALCIUM 80 MG PO TABS
80.0000 mg | ORAL_TABLET | Freq: Every day | ORAL | Status: DC
  Filled 2013-11-30: qty 1

## 2013-11-30 MED ORDER — SODIUM BICARBONATE 8.4 % IV SOLN
INTRAVENOUS | Status: AC
  Filled 2013-11-30: qty 100

## 2013-11-30 MED ORDER — NITROGLYCERIN 0.4 MG SL SUBL: 0.4000 mg | SUBLINGUAL_TABLET | SUBLINGUAL | Status: DC | PRN

## 2013-11-30 MED ORDER — FENTANYL BOLUS VIA INFUSION
100.0000 ug | Freq: Once | INTRAVENOUS | Status: AC
  Administered 2013-11-30: 100 ug via INTRAVENOUS
  Filled 2013-11-30: qty 100

## 2013-11-30 MED ORDER — INSULIN ASPART 100 UNIT/ML ~~LOC~~ SOLN: 2.0000 [IU] | SUBCUTANEOUS | Status: DC

## 2013-11-30 MED ORDER — MIDAZOLAM HCL 2 MG/2ML IJ SOLN
INTRAMUSCULAR | Status: AC
  Filled 2013-11-30: qty 2

## 2013-11-30 MED ORDER — MAGNESIUM SULFATE 40 MG/ML IJ SOLN
INTRAMUSCULAR | Status: AC
  Filled 2013-11-30: qty 50

## 2013-11-30 MED ORDER — DEXTROSE 50 % IV SOLN: 25.0000 mL | INTRAVENOUS | Status: DC | PRN

## 2013-11-30 MED ORDER — AMIODARONE HCL IN DEXTROSE 360-4.14 MG/200ML-% IV SOLN: 30.0000 mg/h | INTRAVENOUS | Status: DC

## 2013-11-30 MED ORDER — FUROSEMIDE 10 MG/ML IJ SOLN: 80.0000 mg | Freq: Three times a day (TID) | INTRAMUSCULAR | Status: DC

## 2013-11-30 MED ORDER — FUROSEMIDE 10 MG/ML IJ SOLN
80.0000 mg | Freq: Once | INTRAMUSCULAR | Status: AC
  Administered 2013-11-30: 80 mg via INTRAVENOUS

## 2013-11-30 MED ORDER — VERAPAMIL HCL 2.5 MG/ML IV SOLN
INTRAVENOUS | Status: AC
  Filled 2013-11-30: qty 2

## 2013-11-30 MED ORDER — FENTANYL CITRATE 0.05 MG/ML IJ SOLN: 50.0000 ug | Freq: Once | INTRAMUSCULAR | Status: DC

## 2013-11-30 MED ORDER — ATROPINE SULFATE 0.1 MG/ML IJ SOLN
INTRAMUSCULAR | Status: AC
  Administered 2013-11-30: 1 mg
  Filled 2013-11-30: qty 10

## 2013-11-30 MED ORDER — ASPIRIN EC 81 MG PO TBEC: 81.0000 mg | DELAYED_RELEASE_TABLET | Freq: Every day | ORAL | Status: DC

## 2013-11-30 MED ORDER — STERILE WATER FOR INJECTION IV SOLN
INTRAVENOUS | Status: DC
  Administered 2013-11-30: 07:00:00 via INTRAVENOUS
  Filled 2013-11-30 (×4): qty 850

## 2013-11-30 MED ORDER — FENTANYL BOLUS VIA INFUSION
25.0000 ug | INTRAVENOUS | Status: DC | PRN
  Filled 2013-11-30: qty 50

## 2013-11-30 MED ORDER — SODIUM CHLORIDE 0.45 % IV SOLN: INTRAVENOUS | Status: DC

## 2013-11-30 MED ORDER — TIROFIBAN HCL IV 5 MG/100ML
0.1500 ug/kg/min | INTRAVENOUS | Status: DC
  Administered 2013-11-30: 0.15 ug/kg/min via INTRAVENOUS
  Filled 2013-11-30 (×4): qty 100

## 2013-11-30 MED ORDER — NOREPINEPHRINE BITARTRATE 1 MG/ML IV SOLN
2.0000 ug/min | INTRAVENOUS | Status: DC
  Administered 2013-11-30: 38 ug/min via INTRAVENOUS
  Filled 2013-11-30 (×2): qty 16

## 2013-11-30 MED ORDER — HYDROCORTISONE NA SUCCINATE PF 100 MG IJ SOLR
50.0000 mg | Freq: Four times a day (QID) | INTRAMUSCULAR | Status: DC
  Filled 2013-11-30 (×4): qty 1

## 2013-11-30 MED ORDER — INSULIN REGULAR BOLUS VIA INFUSION
0.0000 [IU] | Freq: Three times a day (TID) | INTRAVENOUS | Status: DC
Start: 2013-11-30 — End: 2013-11-30

## 2013-11-30 MED ORDER — HEPARIN SODIUM (PORCINE) 1000 UNIT/ML IJ SOLN
INTRAMUSCULAR | Status: AC
  Filled 2013-11-30: qty 1

## 2013-11-30 MED ORDER — PIPERACILLIN-TAZOBACTAM 3.375 G IVPB
3.3750 g | Freq: Three times a day (TID) | INTRAVENOUS | Status: DC
  Administered 2013-11-30: 3.375 g via INTRAVENOUS
  Filled 2013-11-30 (×4): qty 50

## 2013-11-30 MED ORDER — SODIUM BICARBONATE 8.4 % IV SOLN
100.0000 meq | Freq: Once | INTRAVENOUS | Status: AC
  Administered 2013-11-30: 100 meq via INTRAVENOUS

## 2013-11-30 MED ORDER — DEXTROSE 5 % IV SOLN
2.0000 ug/min | INTRAVENOUS | Status: DC
  Filled 2013-11-30: qty 4

## 2013-11-30 MED ORDER — LIDOCAINE HCL (PF) 1 % IJ SOLN
INTRAMUSCULAR | Status: AC
  Filled 2013-11-30: qty 30

## 2013-11-30 MED ORDER — LIDOCAINE BOLUS VIA INFUSION
100.0000 mg | Freq: Once | INTRAVENOUS | Status: DC
  Filled 2013-11-30: qty 100

## 2013-11-30 MED ORDER — AMIODARONE HCL IN DEXTROSE 360-4.14 MG/200ML-% IV SOLN: 60.0000 mg/h | INTRAVENOUS | Status: DC

## 2013-12-01 LAB — GLUCOSE, CAPILLARY: Glucose-Capillary: 561 mg/dL (ref 70–99)

## 2013-12-01 MED FILL — Sodium Chloride IV Soln 0.9%: INTRAVENOUS | Qty: 50 | Status: AC

## 2013-12-01 MED FILL — Medication: Qty: 1 | Status: AC

## 2013-12-02 ENCOUNTER — Other Ambulatory Visit: Payer: Self-pay | Admitting: Internal Medicine

## 2013-12-02 NOTE — Discharge Summary (Signed)
This pt expired in [facility] after being admitted following v. 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 She did not recover and continued to have episodes of cardiac arrest due to v. Fib. She expired on 11/24/2013 despite aggressive attempts at resuscitation.   [name] [name] 12/02/2013 11:56 AM

## 2013-12-15 NOTE — ED Notes (Signed)
lefophed drip 20 mg bobby rn

## 2013-12-15 NOTE — Progress Notes (Signed)
Chaplain responded to [employer], no family was present. Followed up later, consulted with RN at bedside and was informed that family had been notified/updated by physician and was in one of the waiting rooms. Chaplain could not locate family. Requested RN to page chaplain if needed when family returns to bedside.   Maurene CapesHillary D Irusta [phone]

## 2013-12-15 NOTE — ED Notes (Signed)
mag sulfate infused

## 2013-12-15 NOTE — ED Notes (Signed)
vt runs

## 2013-12-15 NOTE — ED Notes (Signed)
Levophed increased 25mg /kg

## 2013-12-15 NOTE — Consult Note (Signed)
Admit date:  Referring Physician Dr. Dierdre Highmanpitz Primary Cardiologist Dr. 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 Allyson SabalBerry in 2009 showing a 70% stenosis but I cannot find a cath report.  2D echo in Dec 2014 showed normal LVF with EF 60-65%.  She  was seen in February by Dr. Chilton SiGreen and apparently was going to be started on Prednisone and cyclophosphamide for Idiopathic membranous glomerulopathy.  Her husband and daughter said that she had not been complaining of chest pain but had had problems with increased LE edema and weight gain.  She woke her [place] up tonight around [place] complaining of severe chest pain.  She was brought to the ER and walked up to the front triage desk, told them she had chest pain and then collapsed with witnessed seizure activity.  She was brought into Trauma room and EKG showed vfib.  She was given an amp of epi and shocked.  CPR was initiated.  She was given 2 more amps of Epi and defibrillated multiple times.  She regained a pulse and rhythm and with EKG showing ST segment elevation in V2 and V3.  She continued to have episodes of Vfib requiring defibrillation.  She was started on Levophed and received an Amio bolus.  She was given 2gm magnesium and calcium pending results of labs due to widening of QRS.  EKG then showed RBBB in V1 and V2 with ST elevation.      PMH:    Past Medical History  Diagnosis Date  . Crohn's disease     hx of crohn's dz  . COPD (chronic obstructive pulmonary disease)   . Coronary artery disease   . Hypertension   . Diabetes mellitus   . Thyroid disease     hypothyroidism  . Arthritis   . Asthma   . AC (acromioclavicular) joint bone spurs     neck  . Depressed   . Nausea alone   . Unspecified gingival  and periodontal disease   . Palpitations   . Allergic rhinitis due to pollen   . Methicillin resistant Staphylococcus aureus septicemia   . Acute bronchitis   . Type II or unspecified type diabetes mellitus without mention of complication, uncontrolled   . Carbuncle and furuncle of trunk   . Abdominal pain, unspecified site   . Sacroiliitis, not elsewhere classified   . Hyperventilation   . Flaccid hemiplegia affecting dominant side   . Pain in joint, pelvic region and thigh   . Cough   . Disturbance of skin sensation   . Herpes zoster with unspecified nervous system complication   . Chest pain, unspecified   . Nonspecific abnormal electrocardiogram (ECG) (EKG)   . Candidiasis of mouth   . Hyperlipidemia   . Tobacco use disorder   . Regional enteritis of large intestine   . Osteoarthrosis, unspecified whether generalized or localized, unspecified site   . Intrinsic asthma, unspecified   . Lumbago   . Blood in stool   . Unspecified nontoxic nodular goiter   . Anxiety   . Edema   . Abdominal pain, right lower quadrant   . Dysmenorrhea   . Unspecified urinary incontinence   . Regional enteritis of unspecified site   . Type II or unspecified type diabetes  mellitus without mention of complication, not stated as uncontrolled   . GERD (gastroesophageal reflux disease)   . Other malaise and fatigue   . Encounter for long-term (current) use of other medications   . Unspecified constipation   . Irritable bowel syndrome   . Diaphragmatic hernia without mention of obstruction or gangrene   . Protein in urine 06/27/2013    PSH:    Past Surgical History  Procedure Laterality Date  . Metatarsal reconstruction  1993  . Tubalization  1982  . Right foot surgery in 1993 twice    . Right foot surgery in 1995    . Colonoscopy  09/10/2006  . Tubal ligation      ALLERGIES:   Avelox; Chantix; Imipramine hcl; and Tetracyclines & related  Prior to Admit Meds:   (Not in a hospital  admission) Family HX:    Family History  Problem Relation Age of Onset  . Stroke Mother    Social HX:    History   Social History  . Marital Status: Widowed    Spouse Name: N/A    Number of Children: N/A  . Years of Education: N/A   Occupational History  . Not on file.   Social History Main Topics  . Smoking status: Current Every Day Smoker -- 1.00 packs/day for 30 years    Types: Cigarettes  . Smokeless tobacco: Never Used  . Alcohol Use: No  . Drug Use: No  . Sexual Activity: Yes    Partners: Male    Birth Control/ Protection: None   Other Topics Concern  . Not on file   Social History Narrative  . No narrative on file     ROS:  All 11 ROS were addressed and are negative except what is stated in the HPI  PHYSICAL EXAM Filed Vitals:   11/28/2013 0420  BP: 90/73  Pulse:    General: Intubated and sedated Head: Eyes PERRLA, No xanthomas.   Normal cephalic and atramatic  Lungs:   Clear anteriorly  Heart:   RRR but bradycardic Abdomen: abdomen soft. Extremities:   No clubbing, cyanosis or edema.  Femoral pulses +1 with CPR Neuro: sedated and intubated  Labs:   Lab Results  Component Value Date   WBC 11.0* 11/27/2013   HGB 8.5* 11/27/2013   HCT 25.0* 11/17/2013   MCV 90.0 11/15/2013   PLT 301 12/09/2013    Recent Labs Lab 12/09/2013 0410  NA 142  K 3.4*  CL 107  BUN 17  CREATININE 1.10  GLUCOSE 335*   Lab Results  Component Value Date   TROPONINI <0.30 06/27/2013   No results found for this basename: PTT   Lab Results  Component Value Date   INR 0.88 06/29/2013     Lab Results  Component Value Date   CHOL 407* 07/31/2013   Lab Results  Component Value Date   HDL 43 07/31/2013   HDL 37* 06/19/2013   Lab Results  Component Value Date   LDLCALC 309 07/31/2013   LDLCALC Comment 06/19/2013   Lab Results  Component Value Date   TRIG 274* 07/31/2013   TRIG 429* 06/19/2013   Lab Results  Component Value Date   CHOLHDL 17.4* 06/19/2013   No  results found for this basename: LDLDIRECT      Radiology:  No results found.  EKG:  #1 NSR with ST elevation in V2 and V2            #2 sinus tachcyardia with RBBB  and ST elevation in V3             #3 sinus bradycardia at 50bpm with marked ST elevation with RBBB in V2 and V3   ASSESSMENT:  1.  Ventricular fibrillation arrest in setting of acute chest pain and EKG showing STEMI 2.  Acute STEMI with anterior MI by EKG 3.  Cardiogenic shock with elevated lactate requiring pressor support 4.  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  HTN 6.  Tobacco abuse ongoing 7.  Crohn's disease in remission 8.  Dyslipidemia 9.  ? History of CAD remotely with cath in 2009 by Dr. Allyson Sabal with ? 70% stenosis but no report in the EPIC   PLAN:   1.  Admit to CCU 2.  Vent management per CCM 3.  Emergent cath per Dr. Sanjuana Kava 4.  IV Amio gtt 5.  IV Levophed for pressor support 6.  2D echo in am to assess LVF   Quintella Reichert, MD  12/03/2013  4:30 AM

## 2013-12-15 NOTE — ED Notes (Signed)
While assessing pt's chief complaints pt stated she was having mid chest pain and her right arm was hurting. Pt then leaned back into the chair, eyes deviated to right, wrist turned toward core, pt was unresponsive to verbal and painful stimulation, pt's skin was diaphoretic

## 2013-12-15 NOTE — ED Notes (Signed)
Pulses back

## 2013-12-15 NOTE — ED Notes (Signed)
Pot chlride drip added lt a-c

## 2013-12-15 NOTE — ED Notes (Signed)
Hr 32

## 2013-12-15 NOTE — ED Notes (Signed)
Shock  ed for vf       100mg  fentanyl and 2mg  of versed by bobby iv.  Pulses.  p 137.

## 2013-12-15 NOTE — ED Notes (Signed)
The pt has pulses

## 2013-12-15 NOTE — ED Notes (Addendum)
mag 2 gms in lt iv morgan rn

## 2013-12-15 NOTE — ED Notes (Signed)
Vf shocked

## 2013-12-15 NOTE — ED Notes (Signed)
Portable chest x-ray

## 2013-12-15 NOTE — ED Notes (Signed)
Shocked for vf

## 2013-12-15 NOTE — ED Notes (Signed)
Amiodarone 150mg  iv by bobby rn

## 2013-12-15 NOTE — ED Notes (Signed)
Pt brought from triage had a seizure while checking in.  Back to  Ta... Unresponsive not breathing very fast no pulses

## 2013-12-15 NOTE — Procedures (Signed)
Central Venous Catheter Insertion Procedure Note [place] Phoudrey Jean Wagler [id number] 05/01/1961  Procedure: Insertion of Central Venous Catheter Indications: Drug and/or fluid administration  Procedure Details Consent: Unable to obtain consent because of emergent medical necessity. Time Out: Verified patient identification, verified procedure, site/side was marked, verified correct patient position, special equipment/implants available, medications/allergies/relevent history reviewed, required imaging and test results available.  Performed  Maximum sterile technique was used including antiseptics, cap, gloves, gown, hand hygiene, mask and sheet. Skin prep: Chlorhexidine; local anesthetic administered A antimicrobial bonded/coated triple lumen catheter was placed in the right internal jugular vein using the Seldinger technique.  Evaluation Blood flow good Complications: No apparent complications Patient did tolerate procedure well. Chest X-ray ordered to verify placement.  CXR: pending.  Bevelyn Bucklesaniel R Tannis Burstein MD 2014-02-26, 7:20 AM

## 2013-12-15 NOTE — Code Documentation (Signed)
CODE BLUE NOTE  Patient Name: [name] [name]   MRN: [id number]   Date of Birth/ Sex: [birth year] , female      Admission Date: 11/17/2013  Attending Provider: Kathleene Hazel, *  Primary Diagnosis: CP STEMI    Indication: Pt noted to be in asystole, code blue subsequently called. At the time of arrival on scene, ACLS protocol was underway. Dr. Delford Field, Dr. Antoine Poche, code blue team by bedside.    Technical Description:  - CPR performance duration:  12 minutes  - Was defibrillation or cardioversion used? No   - Was external pacer placed? Yes  - Was patient intubated pre/post CPR? Yes    Medications Administered: Y = Yes; Blank = No Amiodarone    Atropine  Y  Calcium    Epinephrine  Y  Lidocaine    Magnesium    Norepinephrine    Phenylephrine    Sodium bicarbonate    Vasopressin    Other     Post CPR evaluation:  - Final Status - Was patient successfully resuscitated ? No   Miscellaneous Information:  - Time of death:  12:35 PM  - Primary team notified?  Yes  - Family Notified? Yes         Darden Palmer, MD    I was present for the code blue [name] [name]  11/24/2013, 12:41 PM

## 2013-12-15 NOTE — Progress Notes (Signed)
Patient arrived from cath lab  with IABP in place with Lidocaine, Aggrastat, Levophed, Fentanyl, Versed gtt. Last shock per cath lab was 0545. Oozing at groin sites and mouth. Multiple distraught family members visited patient with chaplain. MD at bedside to place central line.

## 2013-12-15 NOTE — ED Notes (Signed)
Dr Dierdre Highmanpitz given copies of chem 8, lactic acid and troponin results

## 2013-12-15 NOTE — Progress Notes (Signed)
12/21/13 1155  Upon my 1200 vent check I found the patient with a loud air leak Patient O2 sats were 99%. I spoke with the [facility] RN. She called Dr. Delford Field.  He came assess the pt.  He called  anesthesia to come help with the ETT. Patient's sats were still 99%. As anesthesia assess the patient  She begin to code. We as a team were unable to to resuscitate the patient.

## 2013-12-15 NOTE — ED Notes (Signed)
Vf shocked 

## 2013-12-15 NOTE — ED Notes (Signed)
cpr again

## 2013-12-15 NOTE — Progress Notes (Signed)
CPR stopped . Pt pronounced @ 12:35 pm .

## 2013-12-15 NOTE — Progress Notes (Addendum)
Versed 7.5 ml wasted in sink fron gtt bag . Fentanyl 187.5 ml wasted in sink from gtt bag .  verified

## 2013-12-15 NOTE — ED Notes (Signed)
amadarion 150 mg over 10 minutes

## 2013-12-15 NOTE — ED Notes (Signed)
Iv lt a-c  # 18 morgan rn

## 2013-12-15 NOTE — H&P (Addendum)
Admit date: 12/03/2013 Referring Physician Dr. Dierdre Highmanpitz Primary Cardiologist Dr. 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 Allyson SabalBerry in 2009 showing a 70% stenosis but I cannot find a cath report.  2D echo in Dec 2014 showed normal LVF with EF 60-65%.  She  was seen in February by Dr. Chilton SiGreen and apparently was going to be started on Prednisone and cyclophosphamide for Idiopathic membranous glomerulopathy.  Her husband and daughter said that she had not been complaining of chest pain but had had problems with increased LE edema and weight gain.  She woke her [place] up tonight around [place] complaining of severe chest pain.  She was brought to the ER and walked up to the front triage desk, told them she had chest pain and then collapsed with witnessed seizure activity.  She was brought into Trauma room and EKG showed vfib.  She was given an amp of epi and shocked.  CPR was initiated.  She was given 2 more amps of Epi and defibrillated multiple times.  She regained a pulse and rhythm and with EKG showing ST segment elevation in V2 and V3.  She continued to have episodes of Vfib requiring defibrillation.  She was started on Levophed and received an Amio bolus.  She was given 2gm magnesium and calcium pending results of labs due to widening of QRS.  EKG then showed RBBB in V1 and V2 with ST elevation.      PMH:    Past Medical History  Diagnosis Date  . Crohn's disease     hx of crohn's dz  . COPD (chronic obstructive pulmonary disease)   . Coronary artery disease   . Hypertension   . Diabetes mellitus   . Thyroid disease     hypothyroidism  . Arthritis   . Asthma   . AC (acromioclavicular) joint bone spurs     neck  . Depressed   . Nausea alone   . Unspecified gingival  and periodontal disease   . Palpitations   . Allergic rhinitis due to pollen   . Methicillin resistant Staphylococcus aureus septicemia   . Acute bronchitis   . Type II or unspecified type diabetes mellitus without mention of complication, uncontrolled   . Carbuncle and furuncle of trunk   . Abdominal pain, unspecified site   . Sacroiliitis, not elsewhere classified   . Hyperventilation   . Flaccid hemiplegia affecting dominant side   . Pain in joint, pelvic region and thigh   . Cough   . Disturbance of skin sensation   . Herpes zoster with unspecified nervous system complication   . Chest pain, unspecified   . Nonspecific abnormal electrocardiogram (ECG) (EKG)   . Candidiasis of mouth   . Hyperlipidemia   . Tobacco use disorder   . Regional enteritis of large intestine   . Osteoarthrosis, unspecified whether generalized or localized, unspecified site   . Intrinsic asthma, unspecified   . Lumbago   . Blood in stool   . Unspecified nontoxic nodular goiter   . Anxiety   . Edema   . Abdominal pain, right lower quadrant   . Dysmenorrhea   . Unspecified urinary incontinence   . Regional enteritis of unspecified site   . Type II or unspecified type diabetes  mellitus without mention of complication, not stated as uncontrolled   . GERD (gastroesophageal reflux disease)   . Other malaise and fatigue   . Encounter for long-term (current) use of other medications   . Unspecified constipation   . Irritable bowel syndrome   . Diaphragmatic hernia without mention of obstruction or gangrene   . Protein in urine 06/27/2013    PSH:    Past Surgical History  Procedure Laterality Date  . Metatarsal reconstruction  1993  . Tubalization  1982  . Right foot surgery in 1993 twice    . Right foot surgery in 1995    . Colonoscopy  09/10/2006  . Tubal ligation      ALLERGIES:   Avelox; Chantix; Imipramine hcl; and Tetracyclines & related  Prior to Admit Meds:   (Not in a hospital  admission) Family HX:    Family History  Problem Relation Age of Onset  . Stroke Mother    Social HX:    History   Social History  . Marital Status: Widowed    Spouse Name: N/A    Number of Children: N/A  . Years of Education: N/A   Occupational History  . Not on file.   Social History Main Topics  . Smoking status: Current Every Day Smoker -- 1.00 packs/day for 30 years    Types: Cigarettes  . Smokeless tobacco: Never Used  . Alcohol Use: No  . Drug Use: No  . Sexual Activity: Yes    Partners: Male    Birth Control/ Protection: None   Other Topics Concern  . Not on file   Social History Narrative  . No narrative on file     ROS:  All 11 ROS were addressed and are negative except what is stated in the HPI  PHYSICAL EXAM Filed Vitals:   11/28/2013 0420  BP: 90/73  Pulse:    General: Intubated and sedated Head: Eyes PERRLA, No xanthomas.   Normal cephalic and atramatic  Lungs:   Clear anteriorly  Heart:   RRR but bradycardic Abdomen: abdomen soft. Extremities:   No clubbing, cyanosis or edema.  Femoral pulses +1 with CPR Neuro: sedated and intubated  Labs:   Lab Results  Component Value Date   WBC 11.0* 11/27/2013   HGB 8.5* 11/27/2013   HCT 25.0* 11/17/2013   MCV 90.0 11/15/2013   PLT 301 12/09/2013    Recent Labs Lab 12/09/2013 0410  NA 142  K 3.4*  CL 107  BUN 17  CREATININE 1.10  GLUCOSE 335*   Lab Results  Component Value Date   TROPONINI <0.30 06/27/2013   No results found for this basename: PTT   Lab Results  Component Value Date   INR 0.88 06/29/2013     Lab Results  Component Value Date   CHOL 407* 07/31/2013   Lab Results  Component Value Date   HDL 43 07/31/2013   HDL 37* 06/19/2013   Lab Results  Component Value Date   LDLCALC 309 07/31/2013   LDLCALC Comment 06/19/2013   Lab Results  Component Value Date   TRIG 274* 07/31/2013   TRIG 429* 06/19/2013   Lab Results  Component Value Date   CHOLHDL 17.4* 06/19/2013   No  results found for this basename: LDLDIRECT      Radiology:  No results found.  EKG:  #1 NSR with ST elevation in V2 and V2            #2 sinus tachcyardia with RBBB  and ST elevation in V3             #3 sinus bradycardia at 50bpm with marked ST elevation with RBBB in V2 and V3   ASSESSMENT:  1.  Ventricular fibrillation arrest in setting of acute chest pain and EKG showing STEMI 2.  Acute STEMI with anterior MI by EKG 3.  Cardiogenic shock with elevated lactate requiring pressor support 4.  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  HTN 6.  Tobacco abuse ongoing 7.  Crohn's disease in remission 8.  Dyslipidemia 9.  ? History of CAD remotely with cath in 2009 by Dr. Allyson Sabal with ? 70% stenosis but no report in the EPIC   PLAN:   1.  Admit to CCU 2.  Vent management per CCM 3.  Emergent cath per Dr. Sanjuana Kava 4.  IV Amio gtt stopped due to worsening hypotension with boluses. 5.  IV Levophed for pressor support 6.  2D echo in am to assess LVF 7.  Stress dose steroids 8.  IV Lidocaine bolus followed by infusion for ventricular arrhythmias  Quintella Reichert, MD  11/22/2013  4:30 AM

## 2013-12-15 NOTE — Consult Note (Signed)
PULMONARY  / CRITICAL CARE MEDICINE  Name: [name] [name] MRN: [id number] DOB: 1960-12-09 PCP GREEN, Lenon Curt, MD   ADMISSION DATE:  12/13/2013 LOS 0 days  CONSULTATION DATE:  11/21/2013  REFERRING MD :  Clifton James PRIMARY SERVICE: Cardiology   BRIEF PATIENT DESCRIPTION:  52yo AAF with an extensive medical history including nephrotic syndrome from FSGS (on chronic prednisone), DM, HTN, COPD with ongoing tobacco use admitted with acute anterior STEMI c/b repeated VF and cardiogenic shock. PCI of LAD on 11/15/2013 with IAB insertion.   LINES / TUBES: R groin IABP: 11/14/2013  CULTURES: N/A  ANTIBIOTICS: N/A   SIGNIFICANT EVENTS / STUDIES:  VF arrests with multiple DC-CV 11/14/2013 Cardiac cath 11/16/2013. Occluded LAD with PCI    HISTORY OF PRESENT ILLNESS:    53 y/o woman as above. Several day h/o increased LE edema and weight gain. She woke her husband up tonight around [place] complaining of severe chest pain. She was brought to the ER and walked up to the front triage desk, told them she had chest pain and then collapsed with witnessed seizure activity. She was brought into Trauma room and EKG showed vfib. She was given an amp of epi and shocked. CPR was initiated. She was given 2 more amps of Epi and defibrillated multiple times. She regained a pulse and rhythm and with EKG showing ST segment elevation in V2 and V3. She continued to have episodes of Vfib requiring defibrillation. She was started on Levophed and received an Amio bolus. She was given 2gm magnesium and calcium pending results of labs due to widening of QRS. EKG then showed RBBB in V1 and V2 with ST. She was taken emergently to cath lab and IABP placed.   Cath with total LAD and treated with PCI/stenting of LAD. Had recurrent VF during cath and received multiple more shocks during cath. Unable to tolerate amio due to HTN. Switch to lidocaine. Treated with high-dose levophed and bicarb for severe acidosis.  PAST MEDICAL  HISTORY :  Past Medical History  Diagnosis Date  . Crohn's disease     hx of crohn's dz  . COPD (chronic obstructive pulmonary disease)   . Coronary artery disease   . Hypertension   . Diabetes mellitus   . Thyroid disease     hypothyroidism  . Arthritis   . Asthma   . AC (acromioclavicular) joint bone spurs     neck  . Depressed   . Nausea alone   . Unspecified gingival and periodontal disease   . Palpitations   . Allergic rhinitis due to pollen   . Methicillin resistant Staphylococcus aureus septicemia   . Acute bronchitis   . Type II or unspecified type diabetes mellitus without mention of complication, uncontrolled   . Carbuncle and furuncle of trunk   . Abdominal pain, unspecified site   . Sacroiliitis, not elsewhere classified   . Hyperventilation   . Flaccid hemiplegia affecting dominant side   . Pain in joint, pelvic region and thigh   . Cough   . Disturbance of skin sensation   . Herpes zoster with unspecified nervous system complication   . Chest pain, unspecified   . Nonspecific abnormal electrocardiogram (ECG) (EKG)   . Candidiasis of mouth   . Hyperlipidemia   . Tobacco use disorder   . Regional enteritis of large intestine   . Osteoarthrosis, unspecified whether generalized or localized, unspecified site   . Intrinsic asthma, unspecified   . Lumbago   . Blood in  stool   . Unspecified nontoxic nodular goiter   . Anxiety   . Edema   . Abdominal pain, right lower quadrant   . Dysmenorrhea   . Unspecified urinary incontinence   . Regional enteritis of unspecified site   . Type II or unspecified type diabetes mellitus without mention of complication, not stated as uncontrolled   . GERD (gastroesophageal reflux disease)   . Other malaise and fatigue   . Encounter for long-term (current) use of other medications   . Unspecified constipation   . Irritable bowel syndrome   . Diaphragmatic hernia without mention of obstruction or gangrene   . Protein in  urine 06/27/2013     Family History  Problem Relation Age of Onset  . Stroke Mother      History   Social History  . Marital Status: Widowed    Spouse Name: N/A    Number of Children: N/A  . Years of Education: N/A   Occupational History  . Not on file.   Social History Main Topics  . Smoking status: Current Every Day Smoker -- 1.00 packs/day for 30 years    Types: Cigarettes  . Smokeless tobacco: Never Used  . Alcohol Use: No  . Drug Use: No  . Sexual Activity: Yes    Partners: Male    Birth Control/ Protection: None   Other Topics Concern  . Not on file   Social History Narrative  . No narrative on file     Allergies  Allergen Reactions  . Avelox [Moxifloxacin Hcl In Nacl] Swelling  . Chantix [Varenicline] Nausea Only  . Imipramine Hcl Swelling  . Tetracyclines & Related Swelling     REVIEW OF SYSTEMS:  Not available beyond what is in HPI as patient comatose  SUBJECTIVE:   VITAL SIGNS: Filed Vitals:   12/04/2013 0413 12/14/2013 0420 11/17/2013 0430 12/13/2013 0610  BP: 76/64 90/73 76/64  112/72  Pulse: 76  70 92  Resp:   19 18  SpO2: 84%  83%       HEMODYNAMICS:   VENTILATOR SETTINGS: Vent Mode:  [-] PRVC FiO2 (%):  [30 %] 30 % Set Rate:  [28 bmp] 28 bmp Vt Set:  [420 mL] 420 mL PEEP:  [5 cmH20] 5 cmH20 Plateau Pressure:  [4 cmH20-21 cmH20] 20 cmH20 INTAKE / OUTPUT:    PHYSICAL EXAMINATION: General:  Chronically ill appearing. Intubated. unresponsive HEENT: normal x + ETT Neck: supple. Hard to assess JVP Carotids 1+ bilat; no bruits. No lymphadenopathy or thryomegaly appreciated. Cor: PMI nonpalpable. Regular rate & rhythm. +s3 Lungs: rhonchi/rales Abdomen: soft, obese. nontender, + distended. No hepatosplenomegaly. No bruits or masses. Hypoactice bowel sounds. Extremities: Cool/ no rash, tr edema. R groin IABP Neuro: nonresponsive  LABS: PULMONARY  Recent Labs Lab 12/03/2013 0410  TCO2 17    CBC  Recent Labs Lab 11/17/2013 0355  11/26/2013 0410  HGB 9.1* 8.5*  HCT 28.8* 25.0*  WBC 11.0*  --   PLT 301  --     COAGULATION No results found for this basename: INR,  in the last 168 hours  CARDIAC  No results found for this basename: TROPONINI,  in the last 168 hours No results found for this basename: PROBNP,  in the last 168 hours   CHEMISTRY  Recent Labs Lab 12/03/2013 0410  NA 142  K 3.4*  CL 107  GLUCOSE 335*  BUN 17  CREATININE 1.10   The CrCl is unknown because both a height and weight (above a  minimum accepted value) are required for this calculation.   LIVER No results found for this basename: AST, ALT, ALKPHOS, BILITOT, PROT, ALBUMIN, INR,  in the last 168 hours   INFECTIOUS  Recent Labs Lab 11/23/2013 0408  LATICACIDVEN 14.61*     ENDOCRINE CBG (last 3)  No results found for this basename: GLUCAP,  in the last 72 hours    IMAGING x48h  Dg Chest Portable 1 View  11/29/2013   CLINICAL DATA:  Chest pain, seizure.  EXAM: PORTABLE CHEST - 1 VIEW  COMPARISON:  DG CHEST 2 VIEW dated 06/26/2013  FINDINGS: Endotracheal tube tip projects 3.1 cm above the carina. Multiple EKG lines overlie the patient and may obscure subtle underlying pathology. Pacer pads over left chest.  Cardiac silhouette appears upper limits of normal in size, mediastinal silhouette is nonsuspicious. Right interstitial prominence and patchy airspace opacities. No pleural effusions. No pneumothorax. Soft tissue planes and included osseous structures are nonsuspicious.  IMPRESSION: Endotracheal tube tip projects 3.1 cm above the carina.  Borderline cardiomegaly with nonspecific interstitial and alveolar airspace opacities right lung which could reflect bronchopneumonia.   Electronically Signed   By: Awilda Metroourtnay  Bloomer   On: 12/03/2013 05:18       ASSESSMENT / PLAN:  PULMONARY A: 1) VDRF     2) COPD with ongoing tobacco use P:   Due to cardiogenic shock. Pulmonary edema. Diurese as tolerated. Ventilator bundle.    CARDIOVASCULAR A: 1) CAD/Anterior STEMI s/p LAD PCI      2) Acute systolic HF -> Cardiogenic shock      3) Recurrent VF arrest  P:  Remains very tenuous. Now on IABP and high-dose levophed and lidocaine. Will continue. On bicarb gtt for acidosis. Too unstable for hypothermia. Overall prognosis very poor. Family considering withdrawal of care once her son arrives.    ENDOCRINE A:  1) DM2      2) Adrenal insufficiency P:   Cover with SSI. Will give stress dose steroids due to chronic prednisone use.   F/E/N A: Hypokalemia P supp K+ >= 4.0. Mg >= 2.0  PROPHYLAXIS: P: SQ heparin and protonix.   GLOBAL: Prognosis very poor. Family considering switching to comfort care if VF persists. Remains full code for now.   The patient is critically ill with multiple organ systems failure and requires high complexity decision making for assessment and support, frequent evaluation and titration of therapies, application of advanced monitoring technologies and extensive interpretation of multiple databases.   Critical Care Time devoted to patient care services described in this note is 60  Minutes.   Nicholes Mangoan Bensimhon, MD Critical Care Medicine [facility] Pager: [phone], If no answer or between  15:00h - 7:00h: call [phone] 12/03/2013 6:19 AM

## 2013-12-15 NOTE — Consult Note (Signed)
12:05 called urgently by Dr. Delford Field to evaluate ETT position.  He reports able to ventilate, but pilot balloon leaking/unable to hold positive pressure.  Arrived to find patient hypotensive, tachycardic, with O2 sat 100%.  BS present bilaterally, L>R. Head, lips, tongue massively swollen/edematous, patient had copious bleeding in mouth.  VideoGlide scope inserted.  ETT visualized, but no clear view of glottis.  Still able to ventilate, O2 sat 100%, but patient deteriorated into short run of VT and cardiac arrest.  CPR begun immediately, still able to hear breath sounds, but very difficult to ventilate, O2 sat in 90's with CPR. Persistent attempts to visualize vocal cords and verify ETT position unsuccessful.  We never removed ETT, as ventilation and oxygenation were being provided, and glottic visualization was not possible.   Sandford Craze, MD with Cindie Crumbly, CRNA

## 2013-12-15 NOTE — ED Notes (Signed)
Hr 38

## 2013-12-15 NOTE — ED Notes (Signed)
Io lt tibia

## 2013-12-15 NOTE — ED Notes (Signed)
Blood drawn by dr Dierdre Highman

## 2013-12-15 NOTE — Progress Notes (Signed)
ANTICOAGULATION/ANTIBIOTIC CONSULT NOTE - Initial Consult  Pharmacy Consult for Heparin and Zosyn Indication: severe 3 vessel CAD and asp pna  Allergies  Allergen Reactions  . Avelox [Moxifloxacin Hcl In Nacl] Swelling  . Chantix [Varenicline] Nausea Only  . Imipramine Hcl Swelling  . Tetracyclines & Related Swelling    Patient Measurements:    Ht:  61 in Wt: 87 kg Heparin Dosing Weight: 68 kg  Vital Signs: BP: 112/72 mmHg (05/17 0610) Pulse Rate: 92 (05/17 0610)  Labs:  Recent Labs  08/12/13 0355 08/12/13 0410  HGB 9.1* 8.5*  HCT 28.8* 25.0*  PLT 301  --   CREATININE  --  1.10    The CrCl is unknown because both a height and weight (above a minimum accepted value) are required for this calculation.   Medical History: Past Medical History  Diagnosis Date  . Crohn's disease     hx of crohn's dz  . COPD (chronic obstructive pulmonary disease)   . Coronary artery disease   . Hypertension   . Diabetes mellitus   . Thyroid disease     hypothyroidism  . Arthritis   . Asthma   . AC (acromioclavicular) joint bone spurs     neck  . Depressed   . Nausea alone   . Unspecified gingival and periodontal disease   . Palpitations   . Allergic rhinitis due to pollen   . Methicillin resistant Staphylococcus aureus septicemia   . Acute bronchitis   . Type II or unspecified type diabetes mellitus without mention of complication, uncontrolled   . Carbuncle and furuncle of trunk   . Abdominal pain, unspecified site   . Sacroiliitis, not elsewhere classified   . Hyperventilation   . Flaccid hemiplegia affecting dominant side   . Pain in joint, pelvic region and thigh   . Cough   . Disturbance of skin sensation   . Herpes zoster with unspecified nervous system complication   . Chest pain, unspecified   . Nonspecific abnormal electrocardiogram (ECG) (EKG)   . Candidiasis of mouth   . Hyperlipidemia   . Tobacco use disorder   . Regional enteritis of large intestine   .  Osteoarthrosis, unspecified whether generalized or localized, unspecified site   . Intrinsic asthma, unspecified   . Lumbago   . Blood in stool   . Unspecified nontoxic nodular goiter   . Anxiety   . Edema   . Abdominal pain, right lower quadrant   . Dysmenorrhea   . Unspecified urinary incontinence   . Regional enteritis of unspecified site   . Type II or unspecified type diabetes mellitus without mention of complication, not stated as uncontrolled   . GERD (gastroesophageal reflux disease)   . Other malaise and fatigue   . Encounter for long-term (current) use of other medications   . Unspecified constipation   . Irritable bowel syndrome   . Diaphragmatic hernia without mention of obstruction or gangrene   . Protein in urine 06/27/2013    Medications:  Awaiting electronic med rec  Assessment:  Cardiovascular: 53 y.o. female presents with chest pain in triage, pt then collapsed with witnessed seizure activity. Pt with vfib requiring epinephrine and shocks. Pt with continued episodes of vfib requiring defibrillation. Pt with EKG also showing ST elevation. Taken to cath lab - found to have severe 3 vessel CAD; s/p DES to proximal LAD. Pt with continues vfib arrests post PCI. Pt on tirofiban x 12 hrs post cath then plan for oral antiplatelet agent  via NGT. Pt with IABP in place. To begin heparin 8 hours post sheath removal. Spoke with RN 5/17 0745 - sheath remains in and no plans to take out soon. Pt with Hgb 5.8 (on I-stat) post PCI.   ID: Zosyn D#1 for asp pna. Hypothermic. Wbc elevated to 11.  5/17 Zosyn>>  Noted family discussions to be had and pt may go to comfort care today.  Goal of Therapy:  Resolution of infection Heparin level 0.2-0.5 units/ml Monitor platelets by anticoagulation protocol: Yes   Plan:  1) Will f/u sheath removal and timing of heparin start or further decisions on goals of care. 2) Zosyn 3.375gm IV q8h - each dose over 4 hours. 3) Will f/u micro data,  pt's clinical condition, and renal function  Christoper Fabian, PharmD, BCPS Clinical pharmacist, pager [phone] 12/03/2013,6:34 AM

## 2013-12-15 NOTE — Progress Notes (Signed)
Versed and fentanyl bolus being adm( from gtt bags) per Dr Lynelle Doctor v.o.  Levophed gtt increased by 20 mcg /min  . resp therapists present to assess ETT .

## 2013-12-15 NOTE — ED Provider Notes (Signed)
CSN: [id number]     Arrival date & time 11/14/2013  0315 History   First MD Initiated Contact with Patient 11/22/2013 [phone]     Chief Complaint  Patient presents with  . unresponsive      (Consider location/radiation/quality/duration/timing/severity/associated sxs/prior Treatment) HPI LEVEL 5 caveat  PT presented to triage c/o CP and then collapsed. Found to be in cardiac arrest, CPR initiated and PT intubated. She was in and out of VF/ VT, received multiple rounds of epi, had magnesium, calcium, amiodarone and eventually regained pulses. She was started on Levophed drip, ECG was obtained and cath lab notified. Cooling protocol for ROSC was started.   Past Medical History  Diagnosis Date  . Crohn's disease     hx of crohn's dz  . COPD (chronic obstructive pulmonary disease)   . Coronary artery disease   . Hypertension   . Diabetes mellitus   . Thyroid disease     hypothyroidism  . Arthritis   . Asthma   . AC (acromioclavicular) joint bone spurs     neck  . Depressed   . Nausea alone   . Unspecified gingival and periodontal disease   . Palpitations   . Allergic rhinitis due to pollen   . Methicillin resistant Staphylococcus aureus septicemia   . Acute bronchitis   . Type II or unspecified type diabetes mellitus without mention of complication, uncontrolled   . Carbuncle and furuncle of trunk   . Abdominal pain, unspecified site   . Sacroiliitis, not elsewhere classified   . Hyperventilation   . Flaccid hemiplegia affecting dominant side   . Pain in joint, pelvic region and thigh   . Cough   . Disturbance of skin sensation   . Herpes zoster with unspecified nervous system complication   . Chest pain, unspecified   . Nonspecific abnormal electrocardiogram (ECG) (EKG)   . Candidiasis of mouth   . Hyperlipidemia   . Tobacco use disorder   . Regional enteritis of large intestine   . Osteoarthrosis, unspecified whether generalized or localized, unspecified site   . Intrinsic  asthma, unspecified   . Lumbago   . Blood in stool   . Unspecified nontoxic nodular goiter   . Anxiety   . Edema   . Abdominal pain, right lower quadrant   . Dysmenorrhea   . Unspecified urinary incontinence   . Regional enteritis of unspecified site   . Type II or unspecified type diabetes mellitus without mention of complication, not stated as uncontrolled   . GERD (gastroesophageal reflux disease)   . Other malaise and fatigue   . Encounter for long-term (current) use of other medications   . Unspecified constipation   . Irritable bowel syndrome   . Diaphragmatic hernia without mention of obstruction or gangrene   . Protein in urine 06/27/2013   Past Surgical History  Procedure Laterality Date  . Metatarsal reconstruction  1993  . Tubalization  1982  . Right foot surgery in 1993 twice    . Right foot surgery in 1995    . Colonoscopy  09/10/2006  . Tubal ligation     Family History  Problem Relation Age of Onset  . Stroke Mother    History  Substance Use Topics  . Smoking status: Current Every Day Smoker -- 1.00 packs/day for 30 years    Types: Cigarettes  . Smokeless tobacco: Never Used  . Alcohol Use: No   OB History   Grav Para Term Preterm Abortions TAB SAB Ect Mult  Living                 Review of Systems  Unable to perform ROS level 5 caveat cardiac arrest uinresponsive    Allergies  Avelox; Chantix; Imipramine hcl; and Tetracyclines & related  Home Medications   Prior to Admission medications   Medication Sig Start Date End Date Taking? Authorizing Provider  albuterol (PROVENTIL) (2.5 MG/3ML) 0.083% nebulizer solution Take 2.5 mg by nebulization every 6 (six) hours as needed for shortness of breath.     Historical Provider, MD  albuterol-ipratropium (COMBIVENT) 18-103 MCG/ACT inhaler Inhale 2 puffs into the lungs every 6 (six) hours as needed for wheezing or shortness of breath.    Historical Provider, MD  ALPRAZolam Prudy Feeler) 1 MG tablet Take 1  tablet (1 mg total) by mouth 3 (three) times daily as needed for anxiety. 11/13/13   Claudie Revering, NP  atorvastatin (LIPITOR) 40 MG tablet Take 1 tablet (40 mg total) by mouth daily at 6 PM. 08/07/13   Claudie Revering, NP  chlorhexidine (PERIDEX) 0.12 % solution Use as directed 15 mLs in the mouth or throat daily as needed (for thrush). 08/07/13   Claudie Revering, NP  chlorthalidone (HYGROTON) 25 MG tablet Take one daily 08/10/13   Historical Provider, MD  fluconazole (DIFLUCAN) 100 MG tablet Take 100 mg by mouth daily as needed (for yeast infection).    Historical Provider, MD  furosemide (LASIX) 40 MG tablet Take 40 mg by mouth. 2 by mouth 2 times daily Total 160 mg    Historical Provider, MD  glipiZIDE (GLUCOTROL XL) 5 MG 24 hr tablet Take 1 tablet (5 mg total) by mouth daily. 11/13/13   Kimber Relic, MD  hydrocortisone (PROCTOSOL HC) 2.5 % rectal cream Place 1 application rectally 2 (two) times daily as needed for hemorrhoids or itching. 08/07/13   Claudie Revering, NP  lisinopril (PRINIVIL,ZESTRIL) 40 MG tablet Take 40 mg by mouth daily.    Historical Provider, MD  metFORMIN (GLUCOPHAGE) 1000 MG tablet TAKE 1 TABLET BY MOUTH TWICE DAILY FOR DIABETES 11/04/13   Kimber Relic, MD  ondansetron (ZOFRAN ODT) 4 MG disintegrating tablet Take 1 tablet (4 mg total) by mouth every 8 (eight) hours as needed for nausea or vomiting. 07/08/13   Oneal Grout, MD  Oxycodone HCl 10 MG TABS Take one tablet up to 6 times as needed to control pain 11/13/13   Claudie Revering, NP  polyethylene glycol powder (GLYCOLAX/MIRALAX) powder 17 grams in 4-8 oz fluid daily if needed for laxative 09/03/13   Kimber Relic, MD  predniSONE (DELTASONE) 20 MG tablet Take 2 tablets daily 08/26/13   Historical Provider, MD   BP 76/64  Pulse 76  SpO2 84% Physical Exam  Constitutional:  unresponsive  HENT:  Head: Normocephalic and atraumatic.  Eyes:  Pupils sluggish  Neck: No tracheal deviation present.  Cardiovascular:   Pulseless, no heart sounds  Pulmonary/Chest: No stridor.  Intermittent agonal resp  Abdominal: Soft. She exhibits no distension.  Musculoskeletal:  No spontaneous movemnts  Neurological:  unresponsive  Skin: Skin is warm and dry.    ED Course  IO LINE INSERTION Date/Time: December 16, 2013 3:30 AM Performed by: Sunnie Nielsen Authorized by: Sunnie Nielsen Consent: The procedure was performed in an emergent situation. Required items: required blood products, implants, devices, and special equipment available Patient identity confirmed: arm band Indications: hemodynamic instability Insertion site: left proximal tibia Site preparation: alcohol Insertion device: drill device Insertion: needle was  inserted through the bony cortex Number of attempts: 1 Confirmation method: stability of the needle, easy infusion of fluids and aspiration of blood/marrow Secured with: tape INTUBATION Date/Time: 11/28/2013 4:27 AM Performed by: Sunnie Nielsen Authorized by: Sunnie Nielsen Consent: The procedure was performed in an emergent situation. Patient identity confirmed: arm band Indications: respiratory failure Intubation method: direct Patient status: unconscious Preoxygenation: BVM Laryngoscope size: Mac 4 Tube size: 7.5 mm Tube type: cuffed Number of attempts: 1 Cricoid pressure: yes Cords visualized: yes Post-procedure assessment: chest rise and CO2 detector Breath sounds: equal ETT to lip: 25 cm Tube secured with: ETT holder   (including critical care time) Labs Review Labs Reviewed  CBC - Abnormal; Notable for the following:    WBC 11.0 (*)    RBC 3.20 (*)    Hemoglobin 9.1 (*)    HCT 28.8 (*)    RDW 17.4 (*)    All other components within normal limits  I-STAT CHEM 8, ED - Abnormal; Notable for the following:    Potassium 3.4 (*)    Glucose, Bld 335 (*)    Calcium, Ion 1.80 (*)    Hemoglobin 8.5 (*)    HCT 25.0 (*)    All other components within normal limits  I-STAT TROPOININ,  ED - Abnormal; Notable for the following:    Troponin i, poc 0.37 (*)    All other components within normal limits  I-STAT CG4 LACTIC ACID, ED - Abnormal; Notable for the following:    Lactic Acid, Venous 14.61 (*)    All other components within normal limits    Imaging Review No results found.   EKG Interpretation   Date/Time:  Sunday Nov 30 2013 04:19:48 EDT Ventricular Rate:  125 PR Interval:  98 QRS Duration: 196 QT Interval:  403 QTC Calculation: 581 R Axis:   -82 Text Interpretation:  Wide complex tachycardia  ** ** ACUTE MI / STEMI **  ** Nonspecific IVCD with LAD LVH with secondary repolarization abnormality  Confirmed by Brittnie Lewey  MD, Tecora Eustache ([id number]) on 12/12/2013 4:30:06 AM     CRITICAL CARE Performed by: Sunnie Nielsen Total critical care time: 60 Critical care time was exclusive of separately billable procedures and treating other patients. Critical care was necessary to treat or prevent imminent or life-threatening deterioration. Critical care was time spent personally by me on the following activities: development of treatment plan with patient and/or surrogate as well as nursing, discussions with consultants, evaluation of patient's response to treatment, examination of patient, obtaining history from patient or surrogate, ordering and performing treatments and interventions, ordering and review of laboratory studies, ordering and review of radiographic studies, pulse oximetry and re-evaluation of patient's condition. CAR Dr Mayford Knife bedside during CPR   Cardiopulmonary Resuscitation (CPR) Procedure Note Directed/Performed by: Sunnie Nielsen I personally directed ancillary staff and/or performed CPR in an effort to regain return of spontaneous circulation and to maintain cardiac, neuro and systemic perfusion. Multiple intermittent rounds of CPR, multiple shocks for in and out of VF arrest. See code sheet for complete details  Per husband had been complaining of CP since 12am,  h/o CAD  0425 - taken emergently to cath lab 0440 - notified PCCM  MDM   Dx: Cardiac Arrest, ROSC, STEMI  CPR Intubation Serial ECGs concerning for STEMI ACLS drugs: Epi, amio, magensium, calcium ROCS with cooling protocol Cath lab activated Levophed drip Fentanyl/ versed drip PCCM to admit    Sunnie Nielsen, MD 11/18/2013 [phone]

## 2013-12-15 NOTE — Progress Notes (Signed)
Critical Value from Lab Glucose 633. Dr Delford Field at bedside and informed.

## 2013-12-15 NOTE — Progress Notes (Signed)
Pt. Was transported to CATH lab room 5 without any complications.

## 2013-12-15 NOTE — ED Notes (Signed)
Levophed drip increased to 42m iv

## 2013-12-15 NOTE — ED Notes (Signed)
Calcium chloride iv by bobby rn

## 2013-12-15 NOTE — CV Procedure (Addendum)
Cardiac Catheterization Operative Report  [name] [name] [id number] March 19, 20155:34 AM GREEN, Lenon CurtARTHUR G, MD  Procedure Performed:  1. Left Heart Catheterization 2. Selective Coronary Angiography 3. Left ventricular angiogram (hand injection) 4. PTCA/DES x 1 proximal LAD 5. Placement intra-aortic balloon pump  Operator: Verne Carrowhristopher Missael Ferrari, MD  Indication:  53 yo female with complex medical history including DM, COPD, HTN, chronic kidney disease who walked into the ED tonight and had v. Fib arrest in the lobby. She was coded for 30 minutes in the ED with multiple episodes of recurrent ventricular fibrillation. EKG with ST elevation anterior leads. Brought to cath lab for salvage PCI. Pt had v fib arrest again upon arrival in the cath lab.                                    Procedure Details: Emergency consent was obtained. The patient was brought to the cath lab from the ED. The patient was intubated and sedated with Versed and Fentanyl. The left groin was prepped and a sheath was placed. The balloon pump was placed from the left femoral artery sheath. The right groin was prepped and draped in the usual manner. Using the modified Seldinger access technique, a 6 French sheath was placed in the right femoral artery. Standard diagnostic catheters were used to perform selective coronary angiography. She was found to have total occlusion of the proximal LAD. We proceeded to emergent PCI. Of note, she continued to have malignant arrythmias (VT/VF) during the case. She was started on an amiodarone drip and Lidocaine drip. She was maintained on a Levophed drip for pressure support.   PCI Note: She was given a weight based bolus of Angiomax and a drip was started. She was given a bolus of Aggrastat per protocol and a drip was started. The left main was engaged with a XB LAD 3.5 guiding catheter. I then passed a Cougar IC wire down the LAD. The proximal total occlusion was crossed easily. I then  used a 2.5 x 12 mm balloon to open the occlusion. A 2.75 x 24 mm Xience DES was deployed in the proximal LAD. The stent was post-dilated with a 3.0 x 15 mm Yale balloon x 2. The stenosis was taken from 100% down to 0%. There was excellent flow down the LAD but there is moderate diffuse stenosis throughout the entire vessel.     A pigtail catheter was used to perform a left ventricular angiogram by hand injection.   Following the stent placement, the patient continued to have episodes of ventricular fibrillation requiring defibrillation. The IABP pump was sewn into place and set at 1:1.   Hemodynamic Findings: Central aortic pressure:  Left ventricular pressure:   Angiographic Findings:  Left main: 20% mid and distal stenosis.   Left Anterior Descending Artery: 100% proximal occlusion.   Circumflex Artery: 70-80% proximal stenosis. The first two obtuse marginal branches are very small in caliber. The third OM branch is moderate in caliber with diffuse 50% stenosis.   Right Coronary Artery: Moderate caliber diffusely disease vessel with 70% proximal stenosis, serial 80% mid stenoses, diffuse 50% distal stenosis followed by 99% stenosis leading into the very small caliber PDA (0.25 mm).   Left Ventricular Angiogram: (hand injection) LV function appears to be brisk but the LV is poorly opacified.   Impression: 1. Severe triple vessel CAD 2. Cardiac arrest to cath lab post CPR, multiple  defibrillations.  3. Acute anterior MI secondary to occluded proximal LAD 4. Successful PTCA/DES x 1 proximal LAD 5. Continued v. Fib arrests post PCI  Recommendations: Pt is critically ill. Prognosis is very poor. To CCU with full support. PCCM team involved. Continue Lidocaine, Levophed. Will start bicarb drip. Continue Aggrastat for 12 hours and then can give oral anti-platelet agent if NG tube in place. Echo later today. Discussion with family regarding probable poor outcome per Dr. Gala Romney and Dr. Mayford Knife.         Complications:  None. The patient tolerated the procedure well.

## 2013-12-15 NOTE — Progress Notes (Signed)
Pt did not respond to 1 mg atropine ( given IV via c-line) . Code blue called . CPR begun . SEE CODE BLUE  FLOW SHEETS .

## 2013-12-15 NOTE — ED Notes (Signed)
To cath lab now 

## 2013-12-15 NOTE — Progress Notes (Signed)
Dr aware of  lg amt bloody secretions via ETT and oral and nasal  Orifices. Also, Pt had Lg bloody stool ( w/golden brown particles.

## 2013-12-15 NOTE — Progress Notes (Signed)
PCCM  CTSP for air leak from ETT.  Pt was oxygenating but ? If ventilation with poor return on exhaled volumes.  Also abdomen noted to be distended. Unable to inspect airway with standard laryngoscope .  I consulted anesthesia and Dr Jairo Benarswell Jackson and CRNA promptly responded.  The ETT did not appear to be completely through the cords but instead was in a false lumen but situated closely enough to the vocal cords such that the pt was oxygenating.  While Dr Jean RosenthalJackson and CRNA were attempting to adjust and inspect the Airway, the patient arrested again.   The pt had good oxygenation throughout this period of time.  The pt arrested at 1223PM.  Code blue called and pt received prompt  Resuscitation.  Despite this and adequate oxygenation the pt expired.  We were not able to resuscitate this patient.  The pt expired at 1235pm.    Irene Shipperatrick E WrightMD Beeper  [phone]  Cell  [phone]  If no response or cell goes to voicemail, call beeper [phone]

## 2013-12-15 NOTE — ED Notes (Signed)
Vf runs shocked

## 2013-12-15 NOTE — ED Notes (Signed)
Pt came back from triage unresponsive iv rt wrist bobby rn 0330 epi jett  0331 shocked for vf [id number]  0334 vf shocked intubated by dr Dierdre Highman  [phone] shocked for vf dr turner in the room from 0336.  Shock vf [phone]

## 2013-12-15 NOTE — ED Notes (Signed)
vt shocked

## 2013-12-15 NOTE — Progress Notes (Signed)
Chaplain responded to request from charge nurse to support family of pt in Trauma A. Found pt's fiance was in consult B. He had brought pt to ED after she complained of chest pain. Prayed with pt's fiance and provided emotional support for him and also for pt's daughter when she arrived. Served as Engineer, site. Pt's fiance and I accompanied pt to cath lab entrance and I led him to cath lab waiting area. Pt's daughters and sister arrived. I stayed with them during the procedure and served as liaison with cath lab. Afterwards Dr. Clifton James shared grim prognosis that pt had very little chance of living more than 6-12 hours. Provided grief support for pt's sister and daughters. Pt's sister in particular expressed great appreciation for chaplain support. Moved family to [facility] waiting area. With nurse's permission accompanied family members to very brief bedside visit after which they were told to stay in waiting area until after shift change.

## 2013-12-15 NOTE — ED Notes (Signed)
bp with pulses wide complexes

## 2013-12-15 NOTE — Progress Notes (Signed)
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 Allyson SabalBerry in 2009 showing a 70% stenosis per documentation but no report on epic and EF per prior echo 2D 60-65%. Admitted 10/28/2013 after sudden onset CP around midnight and witnessed collapse with seizure activity in ED.  EKG with VFIB, CPR, epi x3, and multiple defibrillations to achieve ROSC with RBBB and ?anterior STEMI on EKG. Taken emergently to cath lab and found to have severe triple vessel CAD, successful PTCA and DES x1 to prox LAD, IABP placement, and notable for continued vfib arrest post PCI.   Last defibrillation around 5am this morning.  Now sedated, paralyzed, mechanical ventilation, IABP in place, and on pressors. On ASA, aggrastat, bicarb, lidocaine, levophed, and insulin gtt, statin, lasix IV, solucortef, and IV abx.   PHYSICAL EXAM Filed Vitals:   004/14/2015 0800 004/14/2015 0900 004/14/2015 0934 004/14/2015 1000  BP: 128/89 119/73    Pulse: 88 90 92 92  Temp: 92.7 F (33.7 C) 93.2 F (34 C)  93.5 F (34.2 C)  TempSrc: Rectal Rectal  Rectal  Resp: 16 10 12 13   Height:      Weight:      SpO2: 100% 100% 100% 100%   General:  Lying in bed, sedated, paralyzed, on mechanical ventilation Lungs:  Coarse b/l breath sounds, on mechanical ventilation Heart:  RRR, +3/6holosystolic murmur Abdomen:  Soft, obese, hypoactive bowel sounds Extremities:  +1 b/l lower extremity edema, R IABP Neuro: sedated and paralyzed  LABS: Lab Results  Component Value Date   TROPONINI >20.00* 2013-09-19   Results for orders placed during the hospital encounter of 004/14/2015 (from the past 24 hour(s))  CBC     Status: Abnormal   Collection Time    004/14/2015  3:55 AM      Result Value Ref Range   WBC 11.0 (*) 4.0 - 10.5 K/uL   RBC 3.20 (*) 3.87 - 5.11 MIL/uL   Hemoglobin 9.1 (*) 12.0 - 15.0 g/dL   HCT 40.928.8 (*) 81.136.0 - 91.446.0 %   MCV 90.0  78.0 - 100.0 fL   MCH 28.4  26.0 - 34.0 pg   MCHC  31.6  30.0 - 36.0 g/dL   RDW 78.217.4 (*) 95.611.5 - 21.315.5 %   Platelets 301  150 - 400 K/uL  I-STAT TROPOININ, ED     Status: Abnormal   Collection Time    004/14/2015  4:00 AM      Result Value Ref Range   Troponin i, poc 0.37 (*) 0.00 - 0.08 ng/mL   Comment NOTIFIED PHYSICIAN     Comment 3           CG4 I-STAT (LACTIC ACID)     Status: Abnormal   Collection Time    004/14/2015  4:03 AM      Result Value Ref Range   Lactic Acid, Venous 14.74 (*) 0.5 - 2.2 mmol/L  I-STAT CG4 LACTIC ACID, ED     Status: Abnormal   Collection Time    004/14/2015  4:08 AM      Result Value Ref Range   Lactic Acid, Venous 14.61 (*) 0.5 - 2.2 mmol/L  I-STAT CHEM 8, ED     Status: Abnormal   Collection Time    004/14/2015  4:10 AM      Result Value Ref Range   Sodium 142  137 - 147 mEq/L   Potassium 3.4 (*) 3.7 -  5.3 mEq/L   Chloride 107  96 - 112 mEq/L   BUN 17  6 - 23 mg/dL   Creatinine, Ser 1.47  0.50 - 1.10 mg/dL   Glucose, Bld 829 (*) 70 - 99 mg/dL   Calcium, Ion 5.62 (*) 1.12 - 1.23 mmol/L   TCO2 17  0 - 100 mmol/L   Hemoglobin 8.5 (*) 12.0 - 15.0 g/dL   HCT 13.0 (*) 86.5 - 78.4 %   Comment NOTIFIED PHYSICIAN    POCT I-STAT, CHEM 8     Status: Abnormal   Collection Time    December 28, 2013  5:37 AM      Result Value Ref Range   Sodium 134 (*) 137 - 147 mEq/L   Potassium 4.5  3.7 - 5.3 mEq/L   Chloride 105  96 - 112 mEq/L   BUN 16  6 - 23 mg/dL   Creatinine, Ser 6.96  0.50 - 1.10 mg/dL   Glucose, Bld 295 (*) 70 - 99 mg/dL   Calcium, Ion 2.84  1.32 - 1.23 mmol/L   TCO2 10  0 - 100 mmol/L   Hemoglobin 5.8 (*) 12.0 - 15.0 g/dL   HCT 44.0 (*) 10.2 - 72.5 %  POCT I-STAT 3, ART BLOOD GAS (G3+)     Status: Abnormal   Collection Time    12/28/13  5:38 AM      Result Value Ref Range   pH, Arterial 7.039 (*) 7.350 - 7.450   pCO2 arterial 32.1 (*) 35.0 - 45.0 mmHg   pO2, Arterial 185.0 (*) 80.0 - 100.0 mmHg   Bicarbonate 8.7 (*) 20.0 - 24.0 mEq/L   TCO2 10  0 - 100 mmol/L   O2 Saturation 99.0     Acid-base deficit  20.0 (*) 0.0 - 2.0 mmol/L   Sample type ARTERIAL    POCT ACTIVATED CLOTTING TIME     Status: None   Collection Time    12/28/2013  5:40 AM      Result Value Ref Range   Activated Clotting Time 542    POCT I-STAT 3, ART BLOOD GAS (G3+)     Status: Abnormal   Collection Time    12-28-13  7:23 AM      Result Value Ref Range   pH, Arterial 7.199 (*) 7.350 - 7.450   pCO2 arterial 43.5  35.0 - 45.0 mmHg   pO2, Arterial 100.0  80.0 - 100.0 mmHg   Bicarbonate 17.0 (*) 20.0 - 24.0 mEq/L   TCO2 18  0 - 100 mmol/L   O2 Saturation 96.0     Acid-base deficit 10.0 (*) 0.0 - 2.0 mmol/L   Sample type ARTERIAL    TROPONIN I     Status: Abnormal   Collection Time    December 28, 2013  7:30 AM      Result Value Ref Range   Troponin I >20.00 (*) <0.30 ng/mL  PROTIME-INR     Status: Abnormal   Collection Time    12/28/2013  7:30 AM      Result Value Ref Range   Prothrombin Time 20.4 (*) 11.6 - 15.2 seconds   INR 1.80 (*) 0.00 - 1.49  APTT     Status: Abnormal   Collection Time    12-28-2013  7:30 AM      Result Value Ref Range   aPTT 88 (*) 24 - 37 seconds  CBC WITH DIFFERENTIAL     Status: Abnormal   Collection Time    2013-12-28  7:30 AM  Result Value Ref Range   WBC 12.4 (*) 4.0 - 10.5 K/uL   RBC 3.21 (*) 3.87 - 5.11 MIL/uL   Hemoglobin 9.1 (*) 12.0 - 15.0 g/dL   HCT 29.0 (*) 21.1 - 15.5 %   MCV 86.6  78.0 - 100.0 fL   MCH 28.3  26.0 - 34.0 pg   MCHC 32.7  30.0 - 36.0 g/dL   RDW 20.8 (*) 02.2 - 33.6 %   Platelets 332  150 - 400 K/uL   Neutrophils Relative % 70  43 - 77 %   Lymphocytes Relative 25  12 - 46 %   Monocytes Relative 3  3 - 12 %   Eosinophils Relative 1  0 - 5 %   Basophils Relative 1  0 - 1 %   Neutro Abs 8.7 (*) 1.7 - 7.7 K/uL   Lymphs Abs 3.1  0.7 - 4.0 K/uL   Monocytes Absolute 0.4  0.1 - 1.0 K/uL   Eosinophils Absolute 0.1  0.0 - 0.7 K/uL   Basophils Absolute 0.1  0.0 - 0.1 K/uL   RBC Morphology POLYCHROMASIA PRESENT    TSH     Status: None   Collection Time    12-02-13   7:30 AM      Result Value Ref Range   TSH 3.330  0.350 - 4.500 uIU/mL  COMPREHENSIVE METABOLIC PANEL     Status: Abnormal   Collection Time    Dec 02, 2013  7:30 AM      Result Value Ref Range   Sodium 139  137 - 147 mEq/L   Potassium 4.0  3.7 - 5.3 mEq/L   Chloride 99  96 - 112 mEq/L   CO2 15 (*) 19 - 32 mEq/L   Glucose, Bld 633 (*) 70 - 99 mg/dL   BUN 19  6 - 23 mg/dL   Creatinine, Ser 1.22  0.50 - 1.10 mg/dL   Calcium 7.7 (*) 8.4 - 10.5 mg/dL   Total Protein 4.3 (*) 6.0 - 8.3 g/dL   Albumin 1.2 (*) 3.5 - 5.2 g/dL   AST 449 (*) 0 - 37 U/L   ALT 110 (*) 0 - 35 U/L   Alkaline Phosphatase 113  39 - 117 U/L   Total Bilirubin 0.4  0.3 - 1.2 mg/dL   GFR calc non Af Amer 68 (*) >90 mL/min   GFR calc Af Amer 79 (*) >90 mL/min  MAGNESIUM     Status: Abnormal   Collection Time    12/02/13  7:30 AM      Result Value Ref Range   Magnesium 3.1 (*) 1.5 - 2.5 mg/dL  GLUCOSE, CAPILLARY     Status: Abnormal   Collection Time    Dec 02, 2013  8:22 AM      Result Value Ref Range   Glucose-Capillary 482 (*) 70 - 99 mg/dL  GLUCOSE, CAPILLARY     Status: Abnormal   Collection Time    02-Dec-2013  8:24 AM      Result Value Ref Range   Glucose-Capillary 554 (*) 70 - 99 mg/dL  GLUCOSE, CAPILLARY     Status: Abnormal   Collection Time    12/02/13  9:27 AM      Result Value Ref Range   Glucose-Capillary 460 (*) 70 - 99 mg/dL  GLUCOSE, CAPILLARY     Status: Abnormal   Collection Time    Dec 02, 2013 10:20 AM      Result Value Ref Range   Glucose-Capillary 518 (*) 70 -  99 mg/dL    Intake/Output Summary (Last 24 hours) at 12/05/2013 1037 Last data filed at 11/27/2013 1000  Gross per 24 hour  Intake 1018.8 ml  Output    485 ml  Net  533.8 ml   EKG:  89bpm, NSR, t wave flattening anterolateral leads, left axis deviation  ASSESSMENT AND PLAN:  Principal Problem:   Cardiac arrest Active Problems:   Hypertension   Type II or unspecified type diabetes mellitus with neurological manifestations,  uncontrolled   Tobacco abuse   Focal segmental glomerulosclerosis   Idiopathic membranous glomerulopathy   STEMI (ST elevation myocardial infarction)   Ventricular fibrillation   Acute respiratory failure   Metabolic acidosis, increased anion gap   Hypotension, unspecified  CAD s/p VF arrest with Anterior STEMI and complicated by cardiogenic shock--s/p multiple defibrillations and emergent cath with severe triple vessel disease, PTCA and DES x1 to prox LAD.  Last VFIB arrest post PCI this morning. IABP in place.  Troponin x1 >20 this morning. Initially on IV amio, stopped due to hypotension -continue asa, statin, aggrastat -on Levophed and Lidocaine -holding BB and ACEi in setting of hypotension -echo pending -on stress dose steroids -remains unstable for hypothermia at this time  VDRF in setting of Vfib arrest and COPD with ongoing tobacco use--intubated, on vent, sedated, and paralyzed -management per PCCM -on solucortef   AG Metabolic Acidosis--AG 25 this morning. On bicarb gtt -trend BMETs  Hyperglycemia in setting of uncontrolled DM2.  A1c 06/2013 9.1 -insuling gtt ICU protocol  Hypokalemia--resolved. 4.0 this morning. Received K and Mag supplementation this morning. Mag up to 3.1.   Acute anemia--Hb 9.1 on admission from 12.8 last noted 06/2013.  Oozing from ETT and RN noted bloody BM. Plt 332. INR 1.8. On Aggrastat.   FSGS HTN  Tobacco abuse Hx of Crohn's disease  Code status: very guarded prognosis. Family by bedside. Considering comfort care.   Case discussed and patient seen with Dr. Antoine Poche  Signed: Darden Palmer, MD PGY-2, Internal Medicine Resident Pager: [phone]  ,11:07 AM

## 2013-12-15 DEATH — deceased

## 2013-12-24 ENCOUNTER — Encounter (HOSPITAL_COMMUNITY): Payer: Medicare Other

## 2013-12-25 ENCOUNTER — Encounter (HOSPITAL_COMMUNITY): Payer: Medicare Other

## 2014-06-25 ENCOUNTER — Encounter (HOSPITAL_COMMUNITY): Payer: Self-pay | Admitting: Cardiovascular Disease

## 2015-02-03 IMAGING — CR DG CHEST 2V
2 series · 2 of 2 positions shown · non-contrast
Comparison: None.

CLINICAL DATA: History of pneumonia, shortness of breath.

EXAM:
CHEST  2 VIEW

[w chest pa]
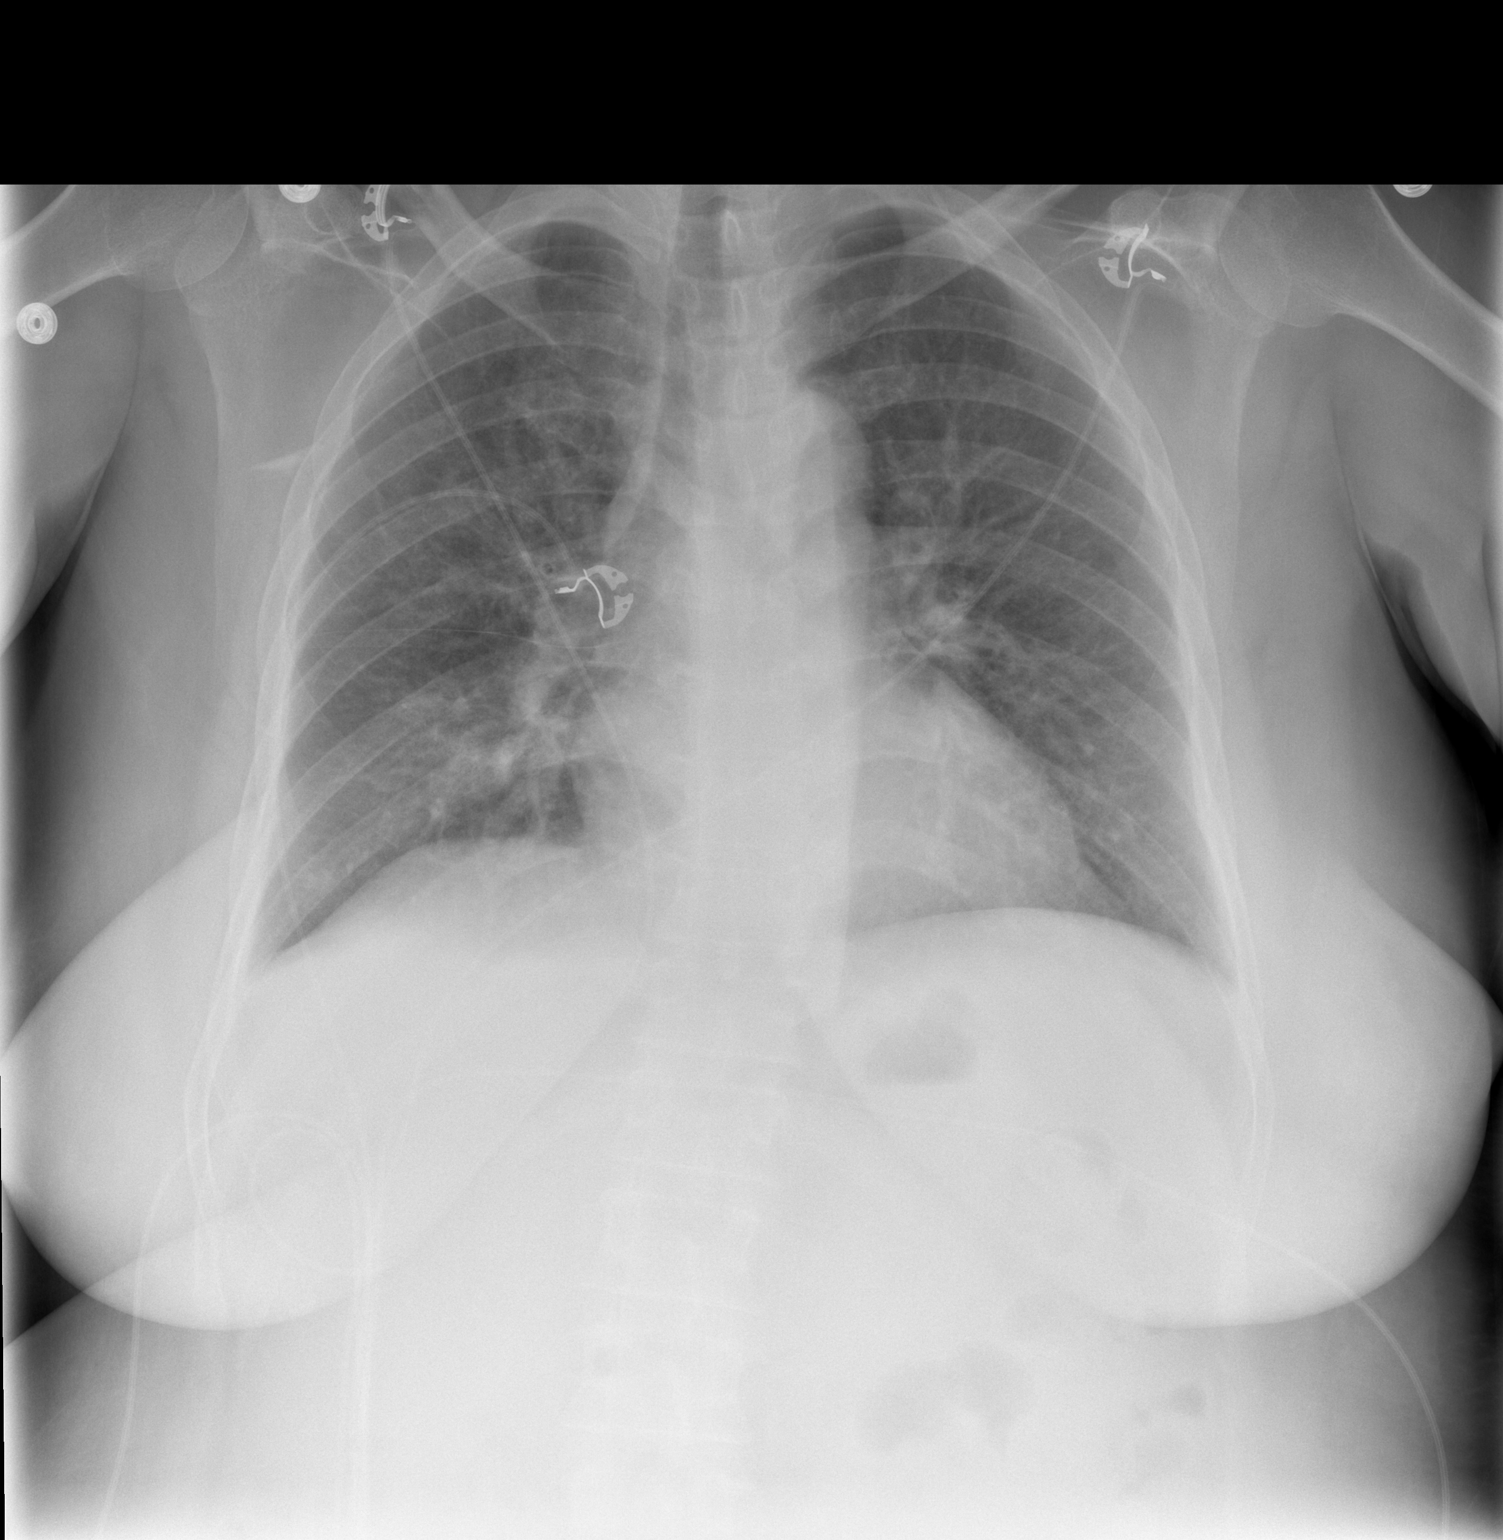

[w chest lat]
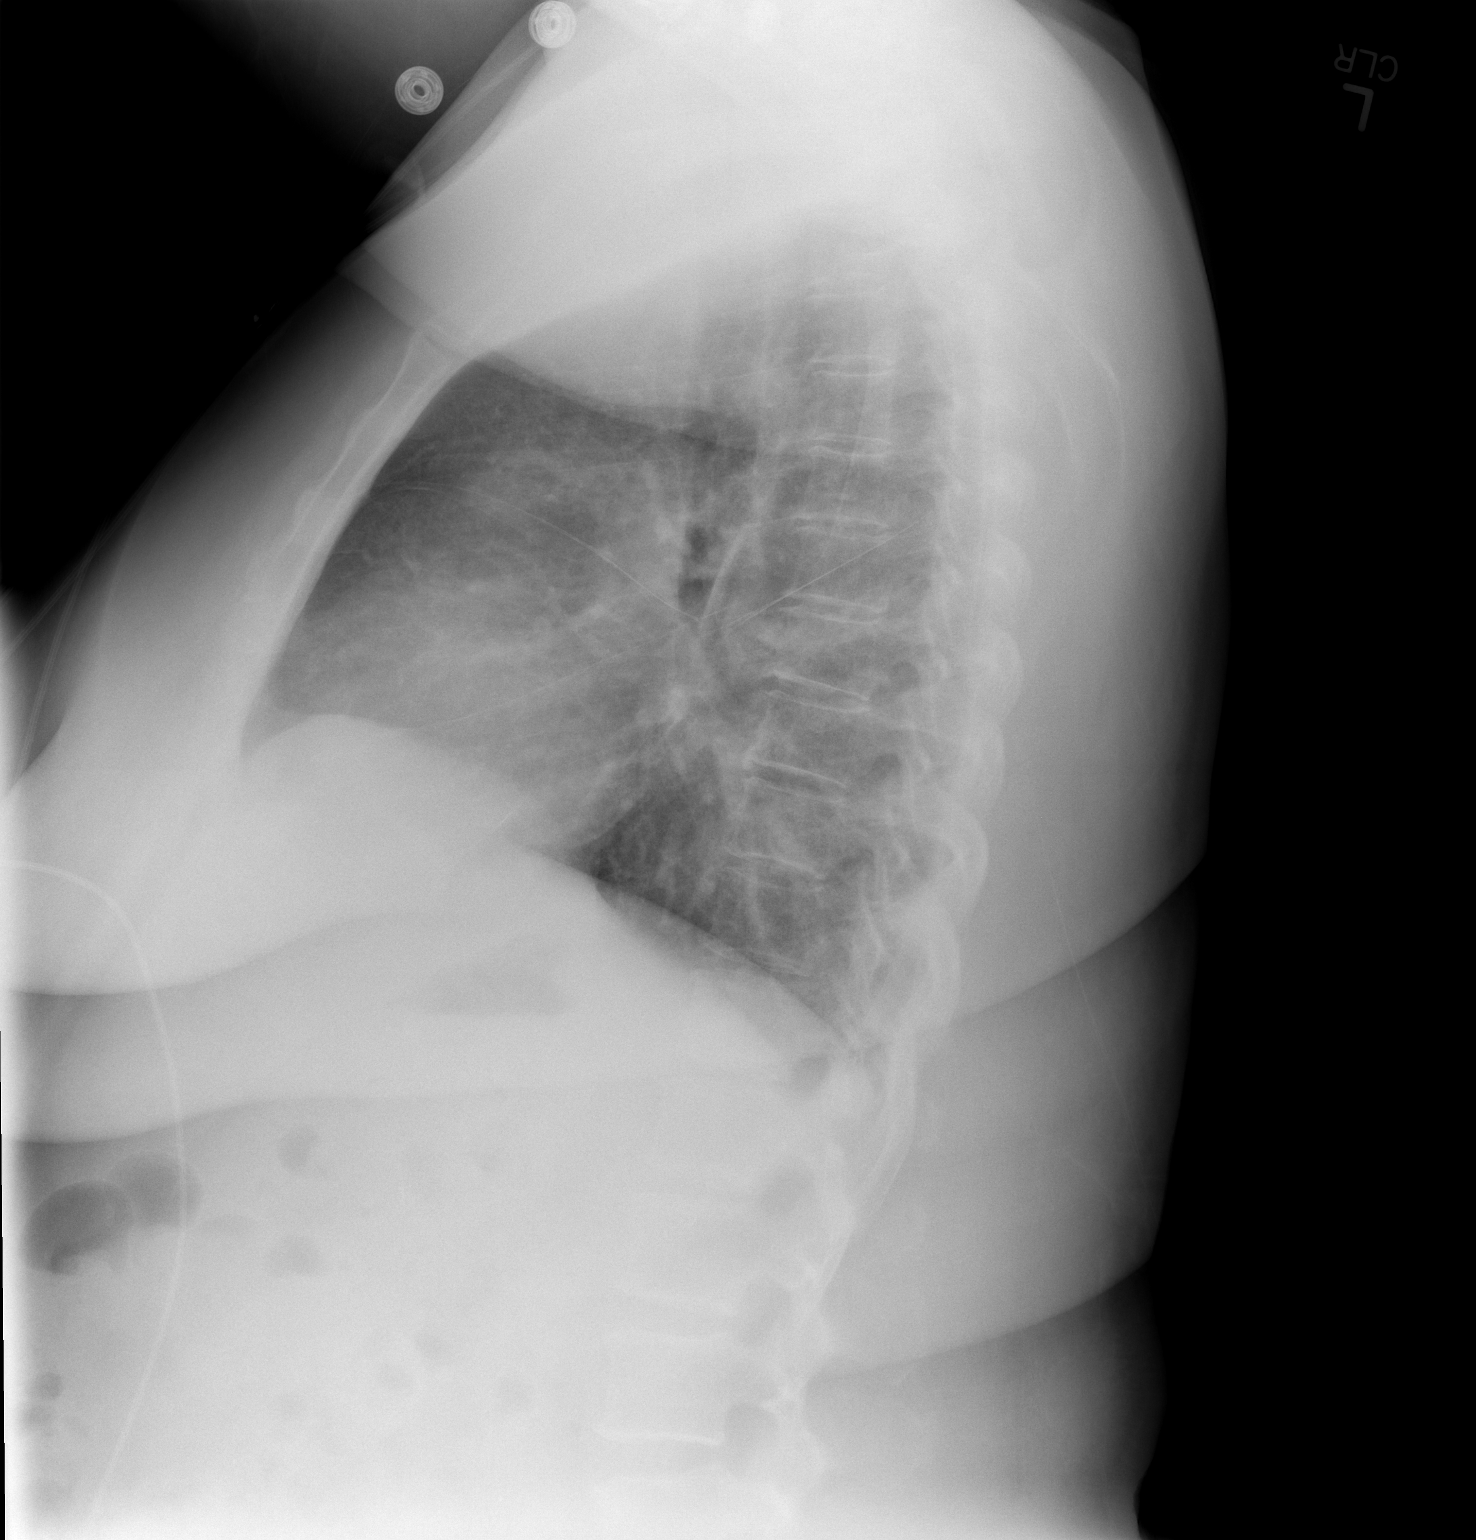

[2 of 2 positions shown; findings below may reference images not displayed]

FINDINGS: Low lung volumes. The heart size and mediastinal contours are within
normal limits. Both lungs are clear. The visualized skeletal
structures are unremarkable.
IMPRESSION: No active cardiopulmonary disease.
